# Patient Record
Sex: Male | Born: 1990 | Race: White | Hispanic: No | Marital: Single | Smoking: Never smoker
Health system: Southern US, Community
[De-identification: ages and names within clinical notes are randomized; demographics above are authoritative.]

## PROBLEM LIST (undated history)

## (undated) DIAGNOSIS — S34102A Unspecified injury to L2 level of lumbar spinal cord, initial encounter: Secondary | ICD-10-CM

## (undated) DIAGNOSIS — S069XAA Unspecified intracranial injury with loss of consciousness status unknown, initial encounter: Secondary | ICD-10-CM

## (undated) DIAGNOSIS — S069X9A Unspecified intracranial injury with loss of consciousness of unspecified duration, initial encounter: Secondary | ICD-10-CM

## (undated) HISTORY — PX: BRAIN SURGERY: SHX531

## (undated) HISTORY — PX: BACK SURGERY: SHX140

---

## 2013-04-10 ENCOUNTER — Ambulatory Visit: Payer: Self-pay | Admitting: Surgery

## 2013-04-10 ENCOUNTER — Encounter: Payer: Self-pay | Admitting: Surgery

## 2013-04-21 ENCOUNTER — Encounter: Payer: Self-pay | Admitting: Surgery

## 2013-06-22 ENCOUNTER — Encounter: Payer: Self-pay | Admitting: Surgery

## 2013-06-26 ENCOUNTER — Encounter: Payer: Self-pay | Admitting: Surgery

## 2013-07-03 ENCOUNTER — Ambulatory Visit: Payer: Self-pay | Admitting: Endocrinology

## 2013-07-20 ENCOUNTER — Encounter: Payer: Self-pay | Admitting: Surgery

## 2013-08-07 ENCOUNTER — Ambulatory Visit: Payer: Self-pay | Admitting: Surgery

## 2013-08-20 ENCOUNTER — Encounter: Payer: Self-pay | Admitting: Surgery

## 2013-08-22 ENCOUNTER — Ambulatory Visit: Payer: Self-pay | Admitting: Surgery

## 2013-09-16 ENCOUNTER — Ambulatory Visit: Payer: Self-pay

## 2013-09-19 ENCOUNTER — Encounter: Payer: Self-pay | Admitting: Surgery

## 2013-10-09 ENCOUNTER — Other Ambulatory Visit: Payer: Self-pay | Admitting: Surgery

## 2013-10-09 LAB — SEDIMENTATION RATE: Erythrocyte Sed Rate: 10 mm/h

## 2014-10-30 IMAGING — CR RIGHT FOOT COMPLETE - 3+ VIEW
1 series · 3 of 3 positions shown · non-contrast
Comparison: None.

CLINICAL DATA: Soft tissue ulcer.

EXAM:
RIGHT FOOT COMPLETE - 3+ VIEW

[Series 1: ap · 0.17mm/px · 3 of 3 slices shown]
[im 1/3]
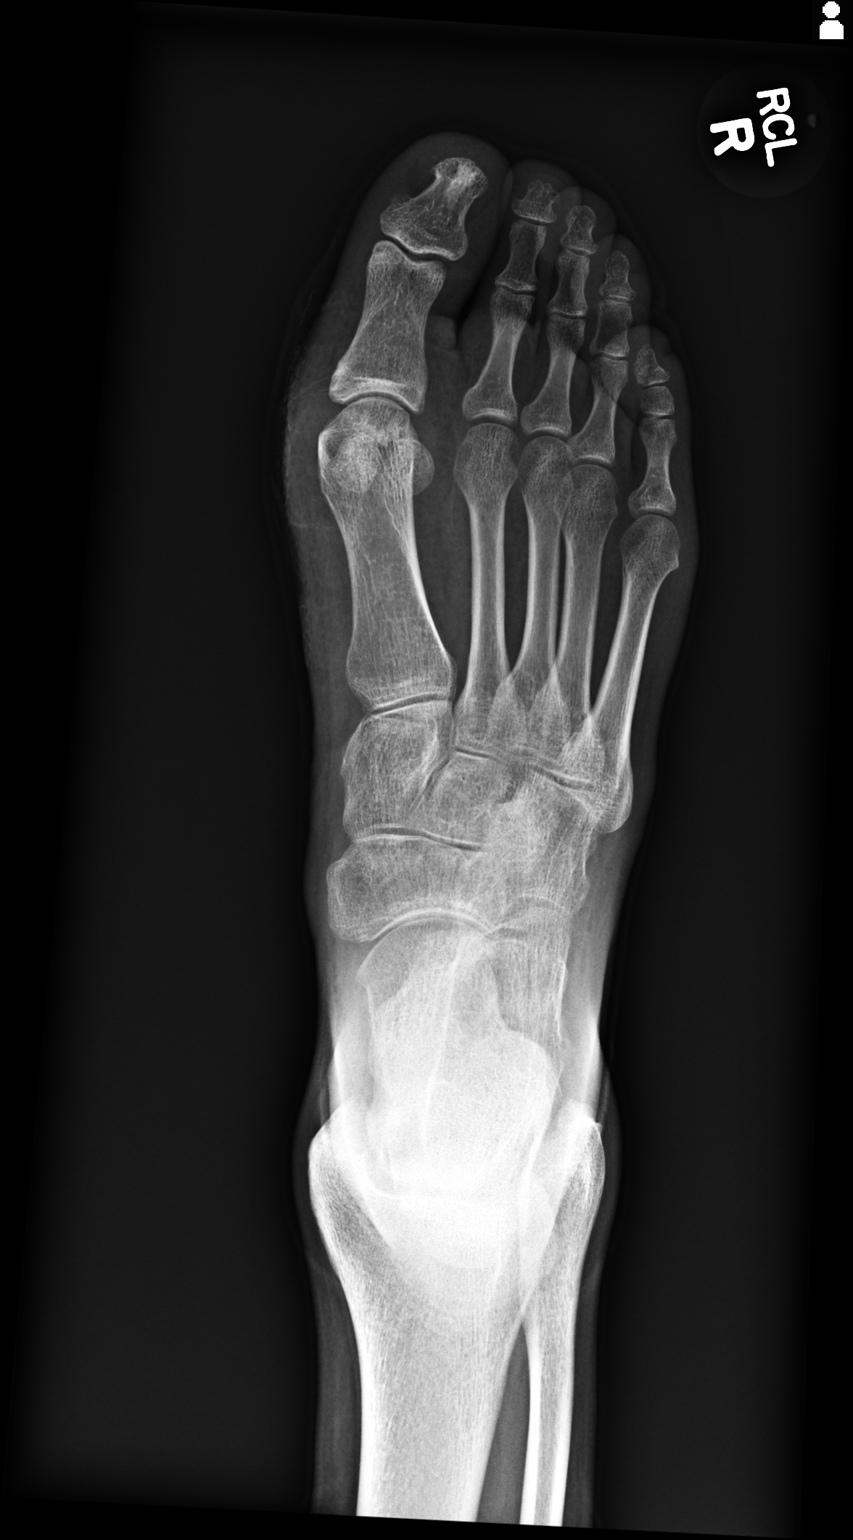
[im 2/3]
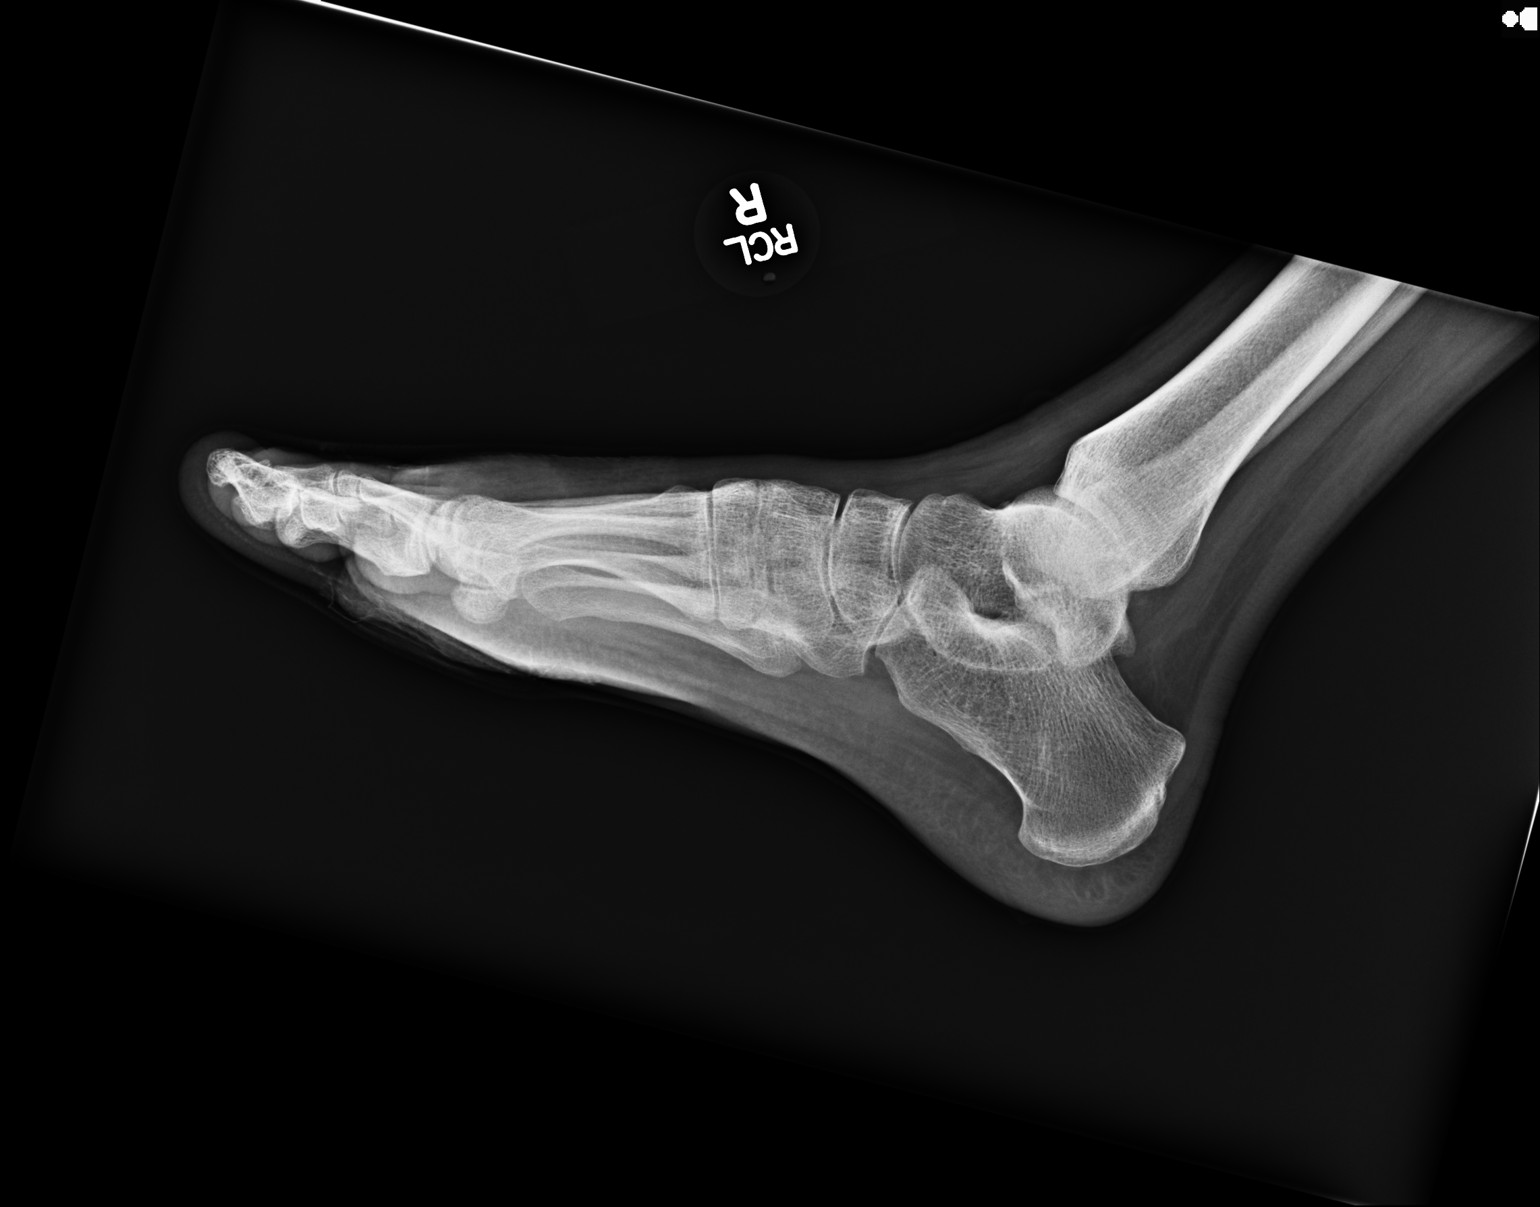
[im 3/3]
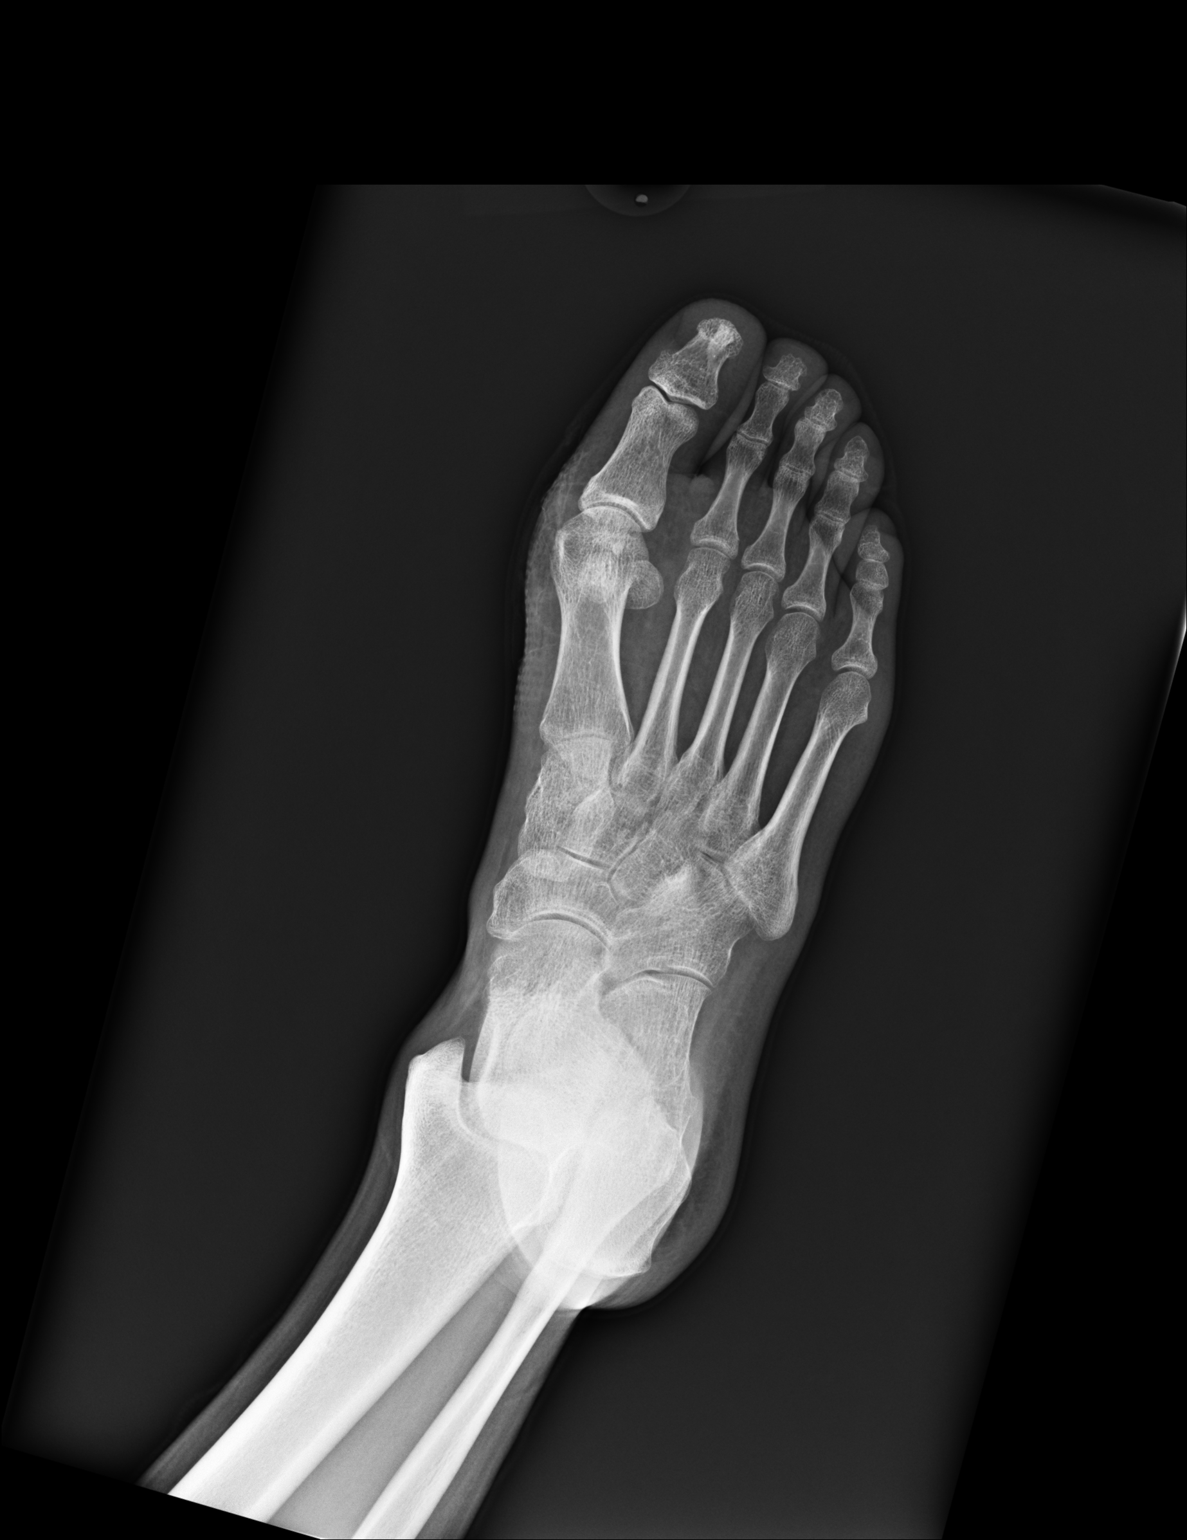

[3 of 3 positions shown; findings below may reference images not displayed]

FINDINGS: Bandage is present medial to the 1st MTP joint. Mild degenerative
change in the 1st MTP.

Negative for fracture or osteomyelitis.
IMPRESSION: No acute abnormality.

## 2015-01-22 IMAGING — CR RIGHT FOURTH TOE
1 series · 3 of 3 positions shown · non-contrast
Comparison: April 10, 2013

CLINICAL DATA: Open wound

EXAM:
RIGHT FOURTH TOE

[Series 1: x toes ap right · 0.14mm/px · 3 of 3 slices shown]
[im 1/3]
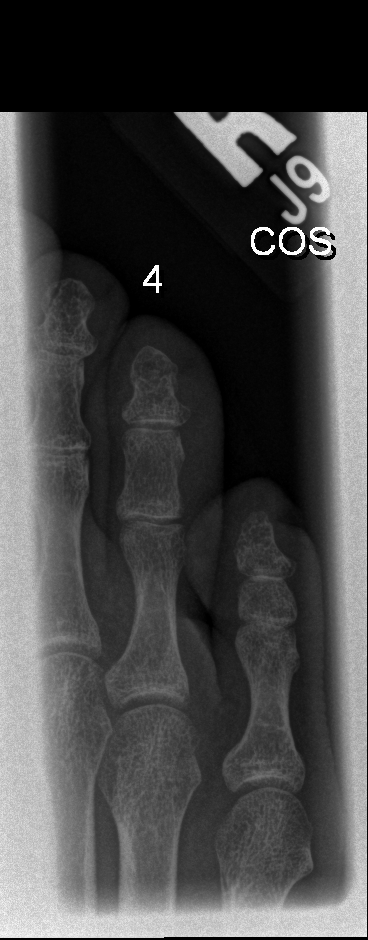
[im 2/3]
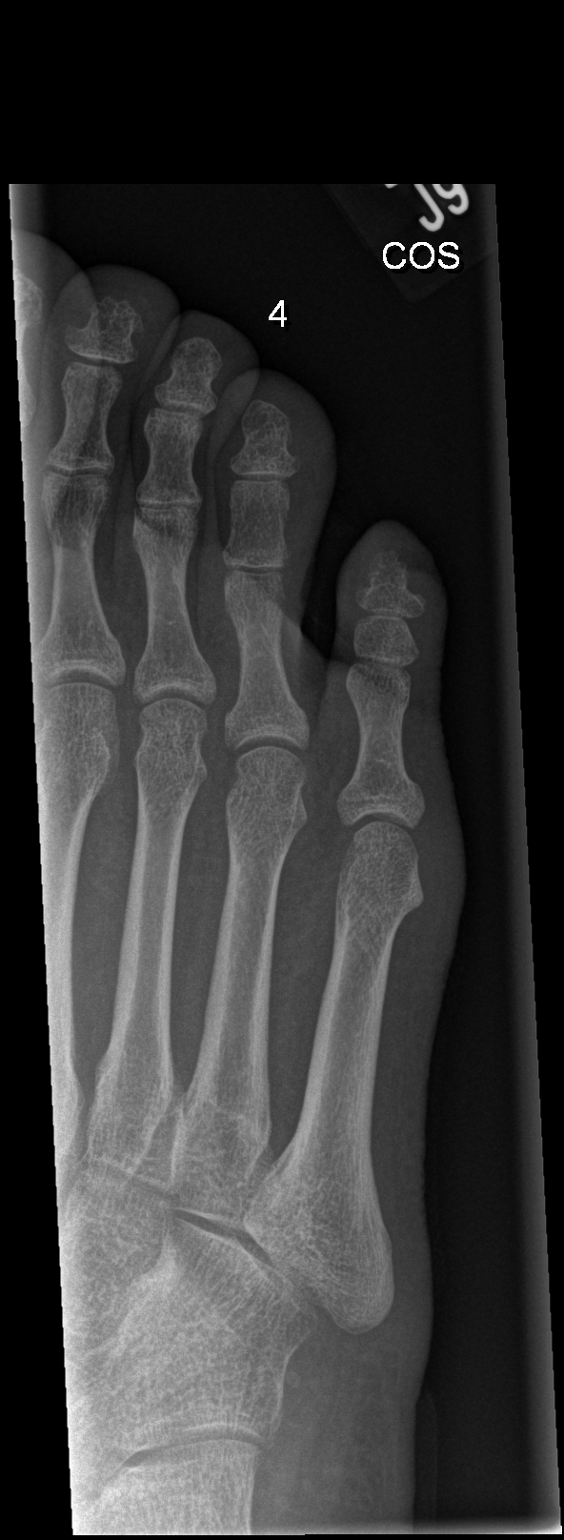
[im 3/3]
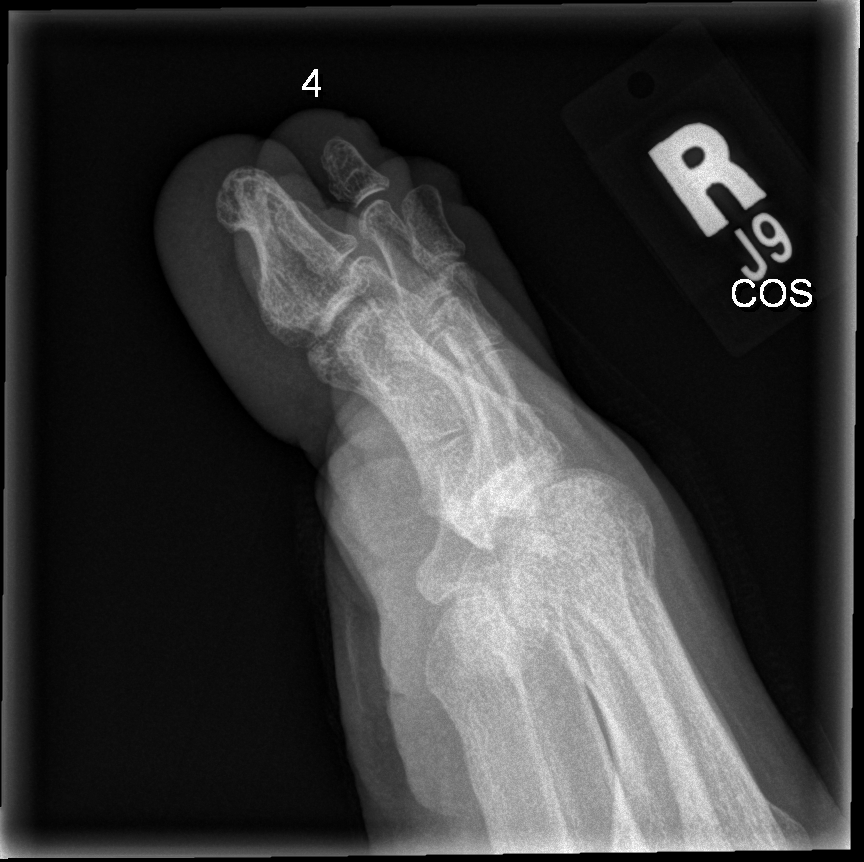

[3 of 3 positions shown; findings below may reference images not displayed]

FINDINGS: Frontal, oblique, and lateral views were obtained. There is no
fracture or dislocation. Joint spaces appear intact. No erosive
change or bony destruction. No soft tissue abscess seen.
IMPRESSION: No fracture or bony destruction apparent.

## 2015-03-13 IMAGING — CR DG CHEST 2V
1 series · 2 of 2 positions shown · non-contrast
Comparison: None.

CLINICAL DATA: Hyperbaric clearance

EXAM:
CHEST  2 VIEW

[Series 1: w chest pa · 0.14mm/px · 2 of 2 slices shown]
[im 1/2]
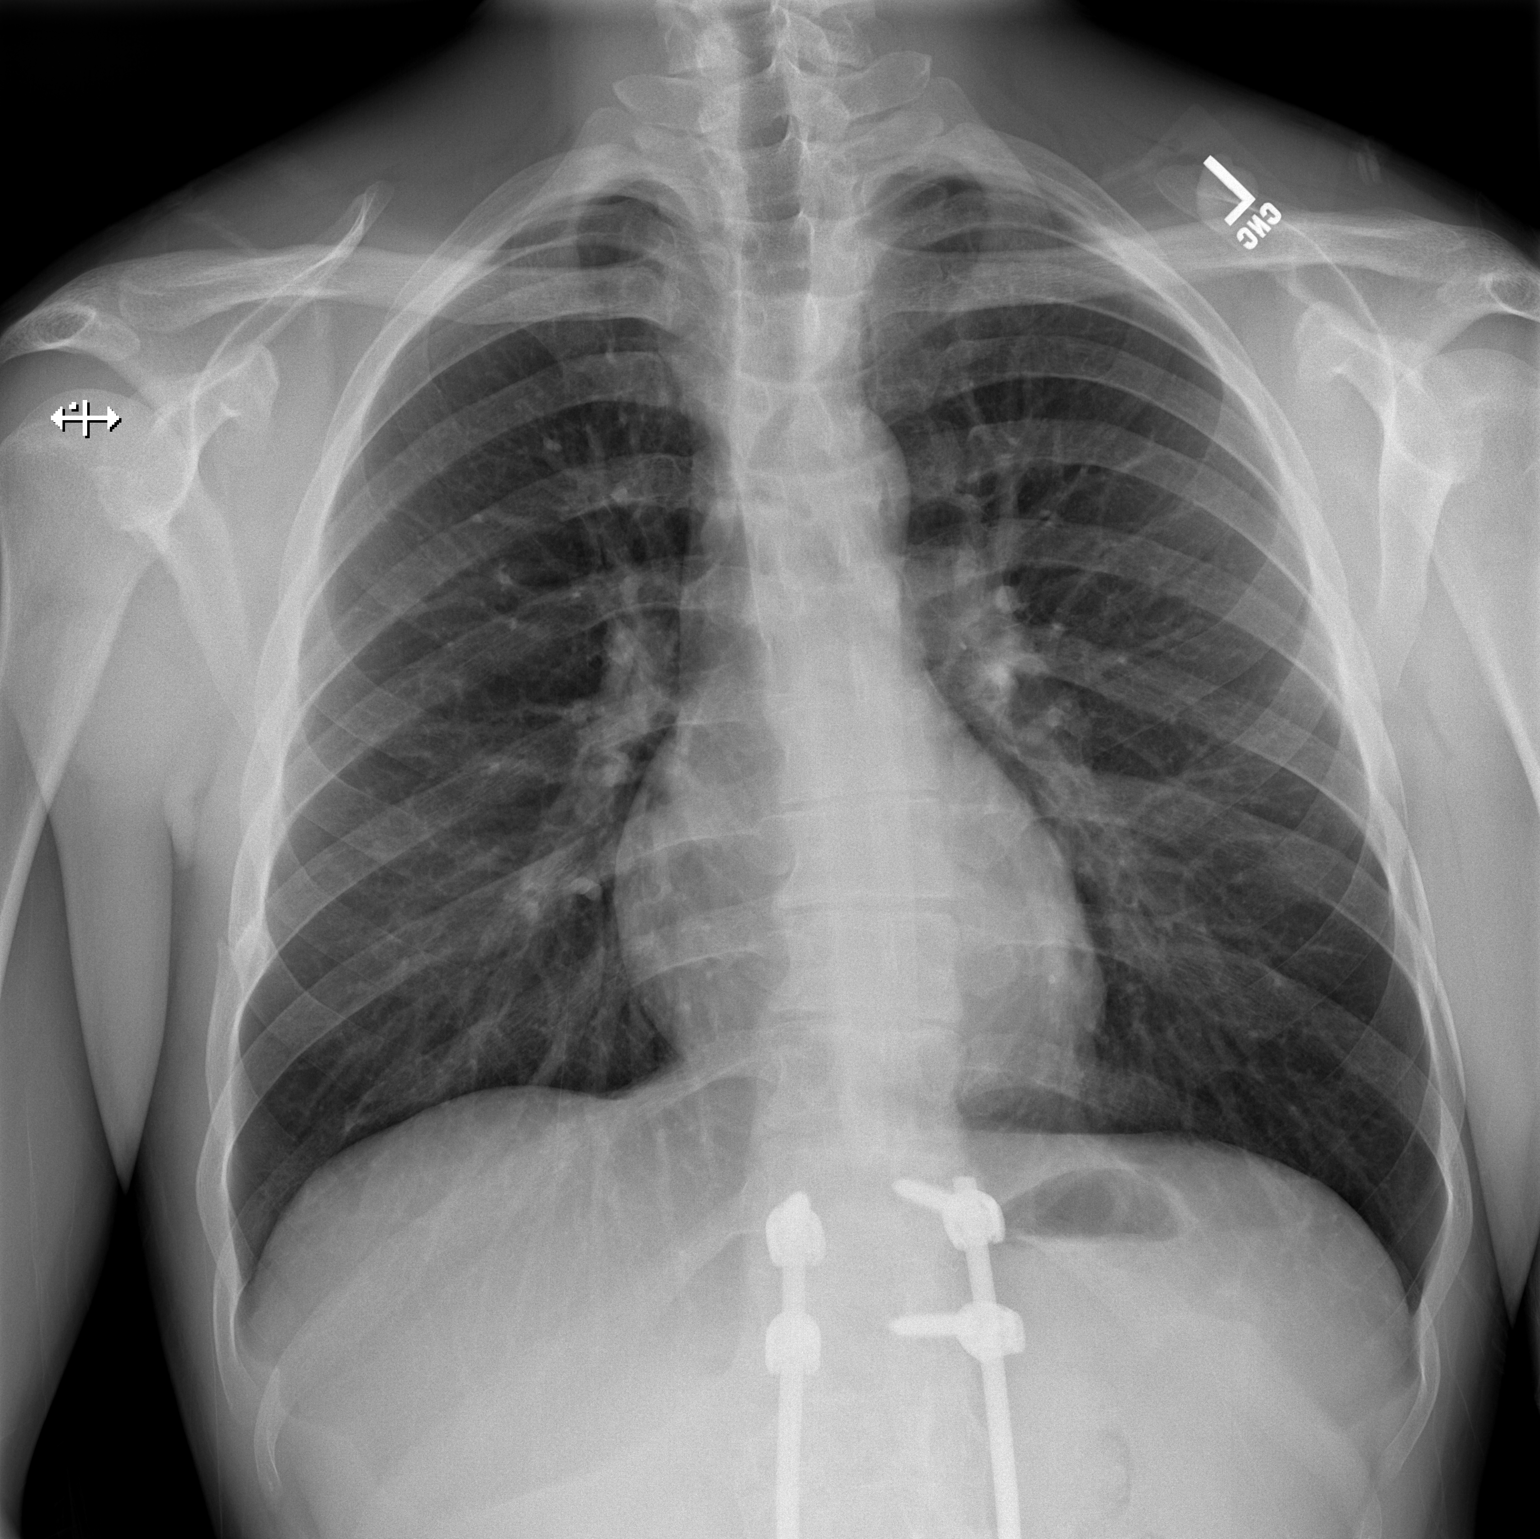
[im 2/2]
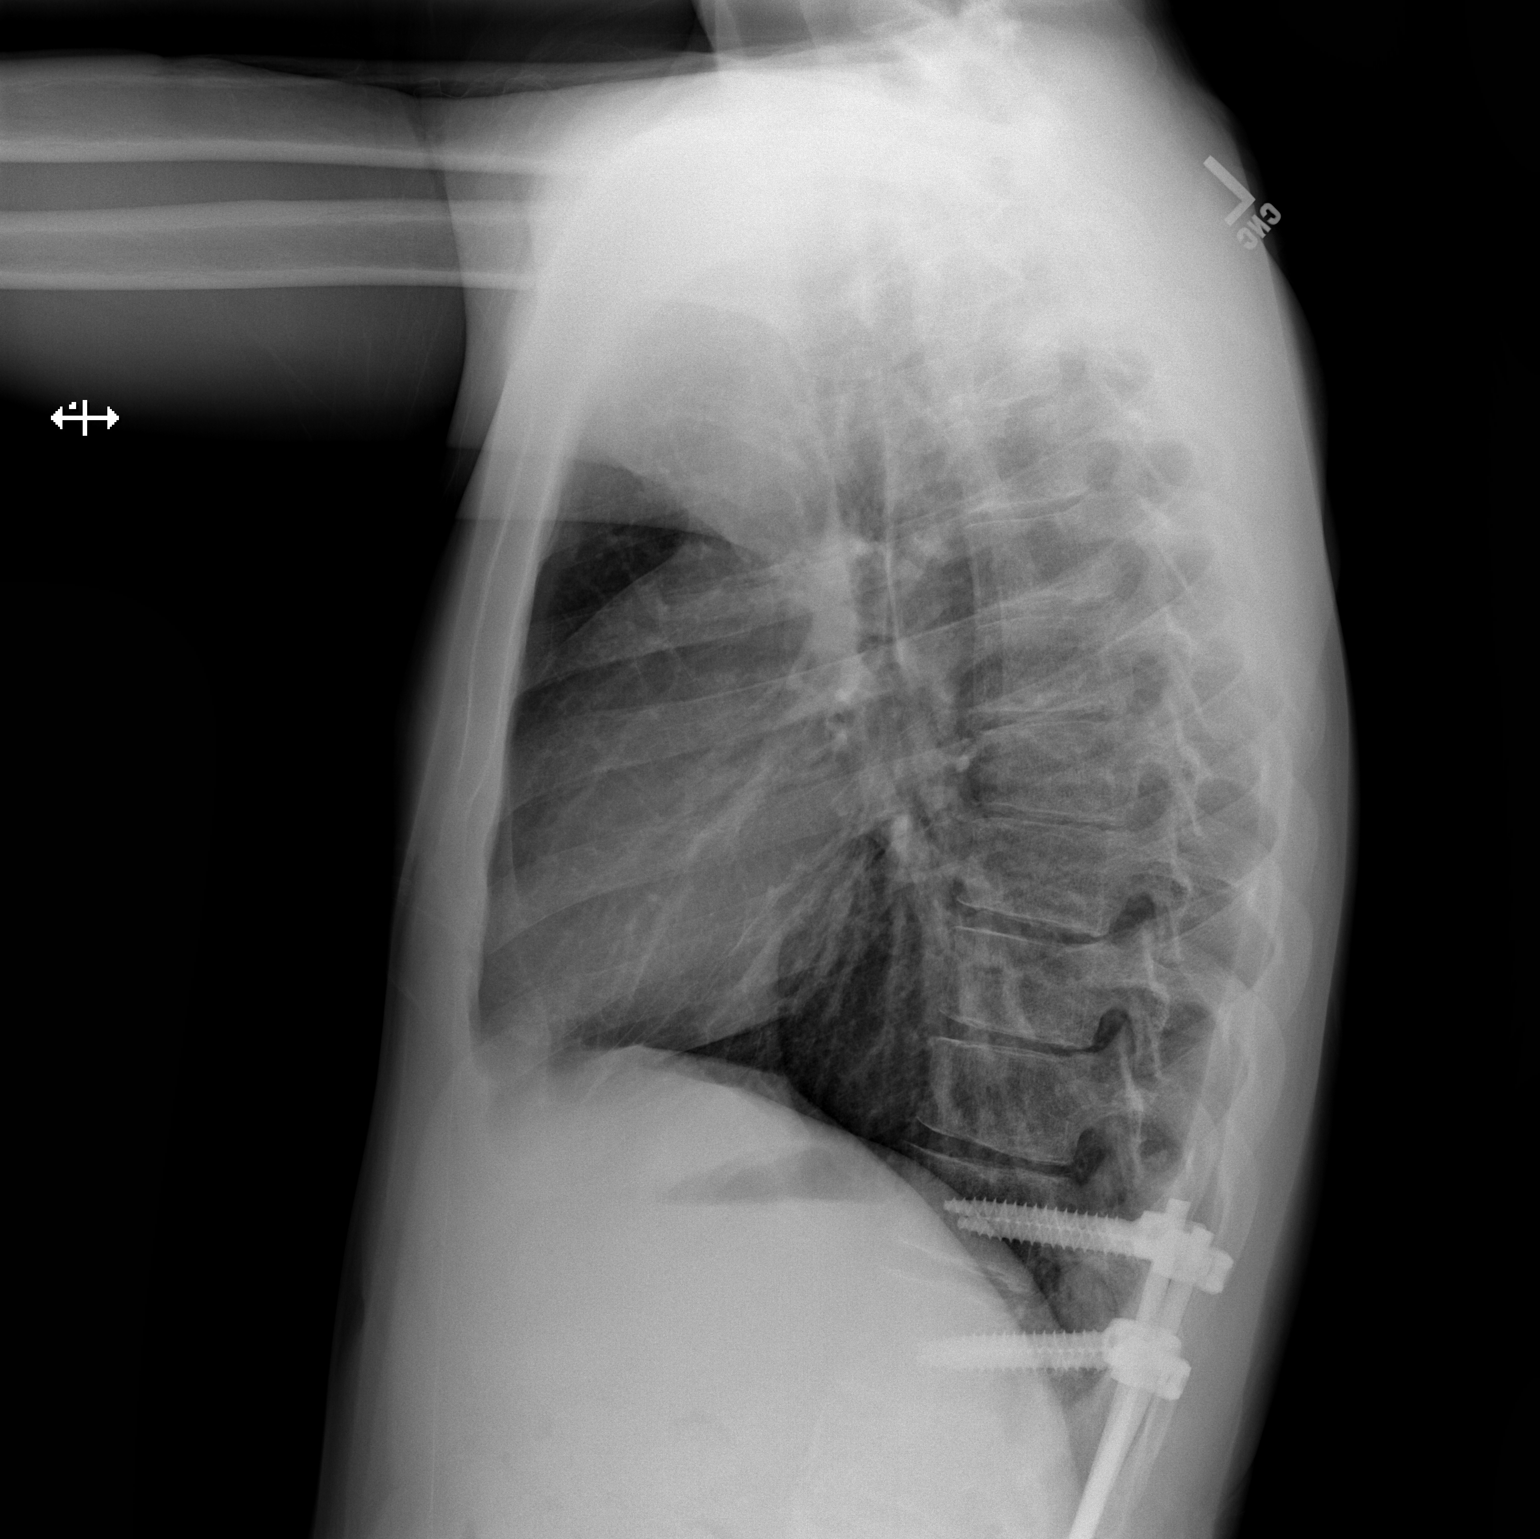

[2 of 2 positions shown; findings below may reference images not displayed]

FINDINGS: The heart size and mediastinal contours are within normal limits.
Both lungs are clear. The visualized skeletal structures show
postoperative changes at thoracolumbar spine. .
IMPRESSION: No active cardiopulmonary disease.

## 2015-04-07 IMAGING — CR DG LUMBAR SPINE COMPLETE 4+V
1 series · 5 of 5 positions shown · non-contrast
Comparison: None.

CLINICAL DATA: Status post fall 6 years ago with an L2 fracture.
Recent fall. Back pain.

EXAM:
LUMBAR SPINE - COMPLETE 4+ VIEW

[Series 1: t sacrum ap · 0.14mm/px · 5 of 5 slices shown]
[im 1/5]
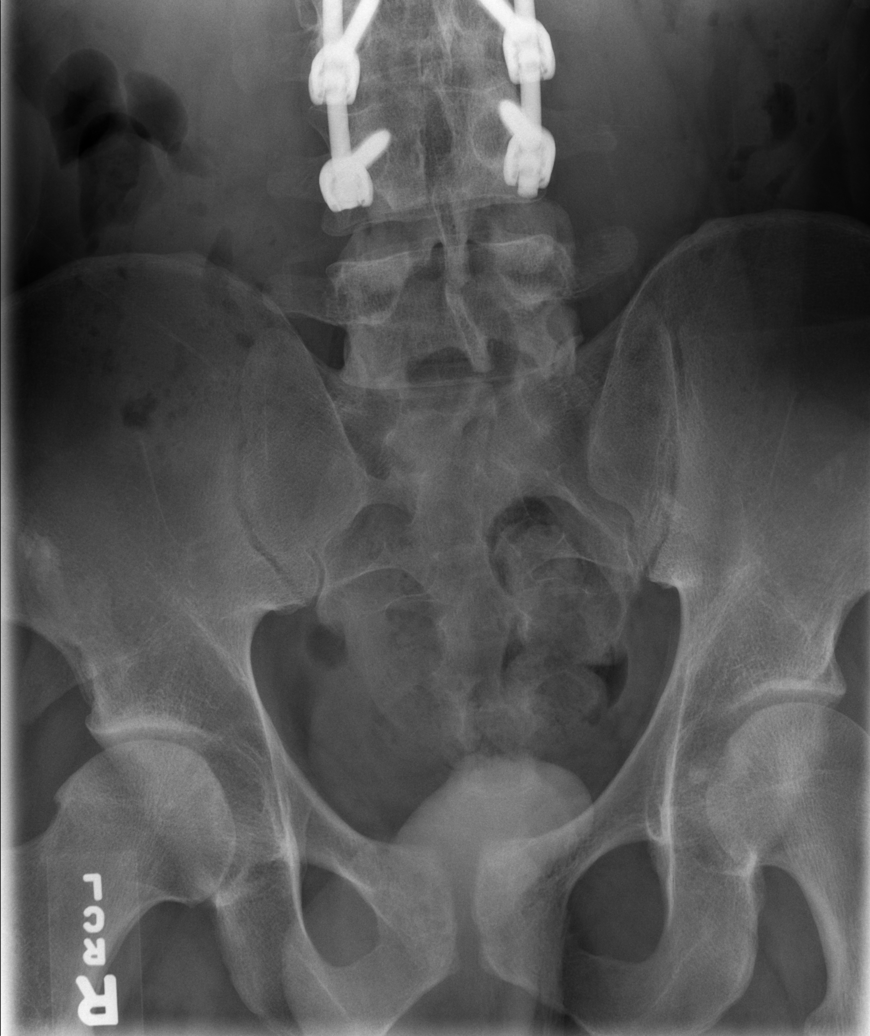
[im 2/5]
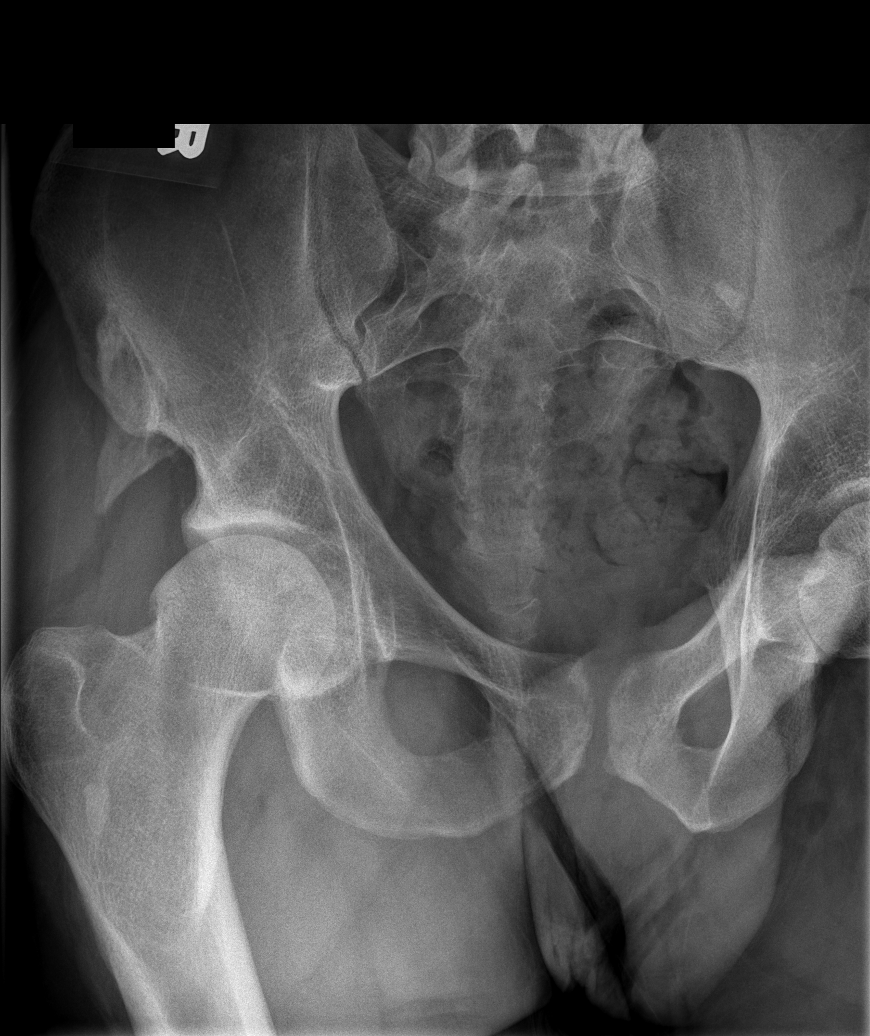
[im 3/5]
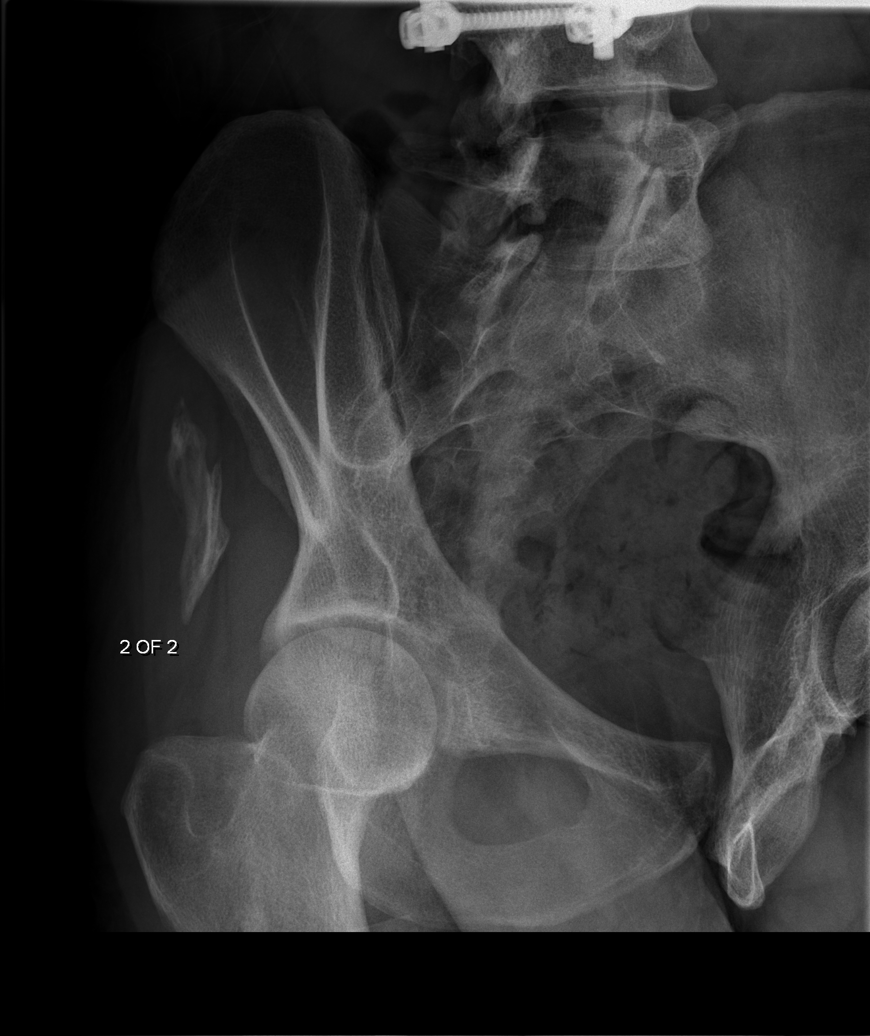
[im 4/5]
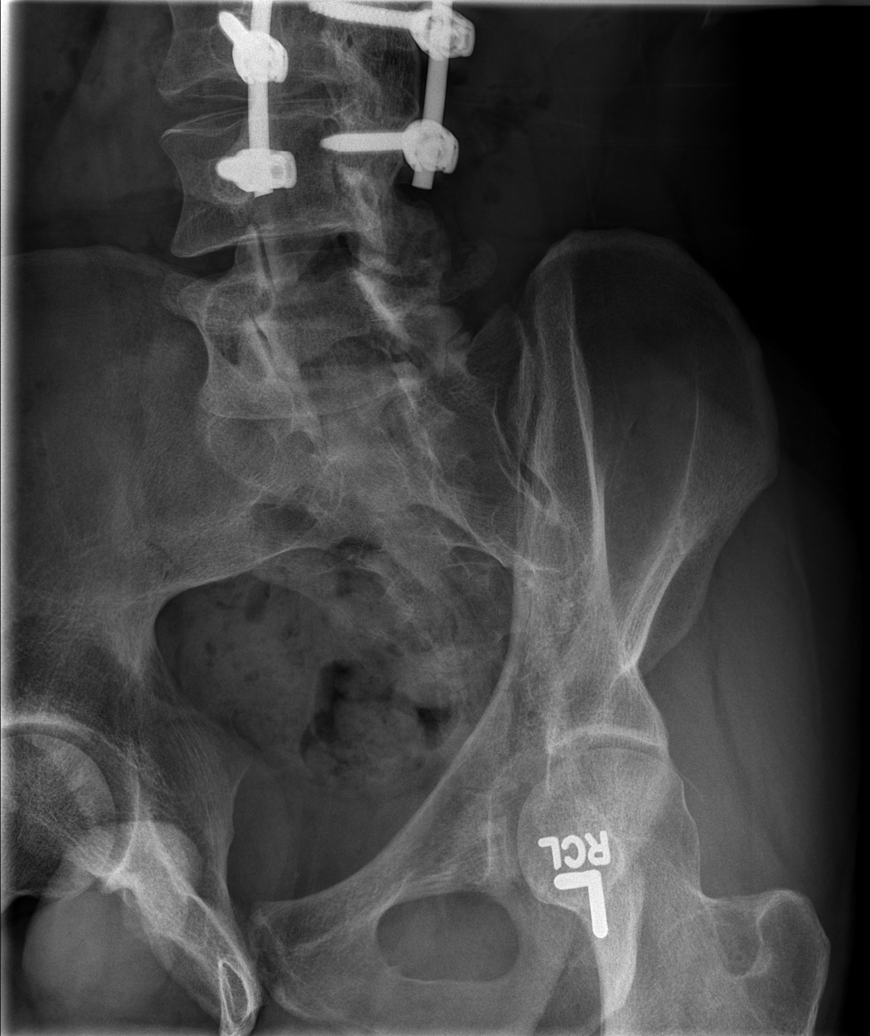
[im 5/5]
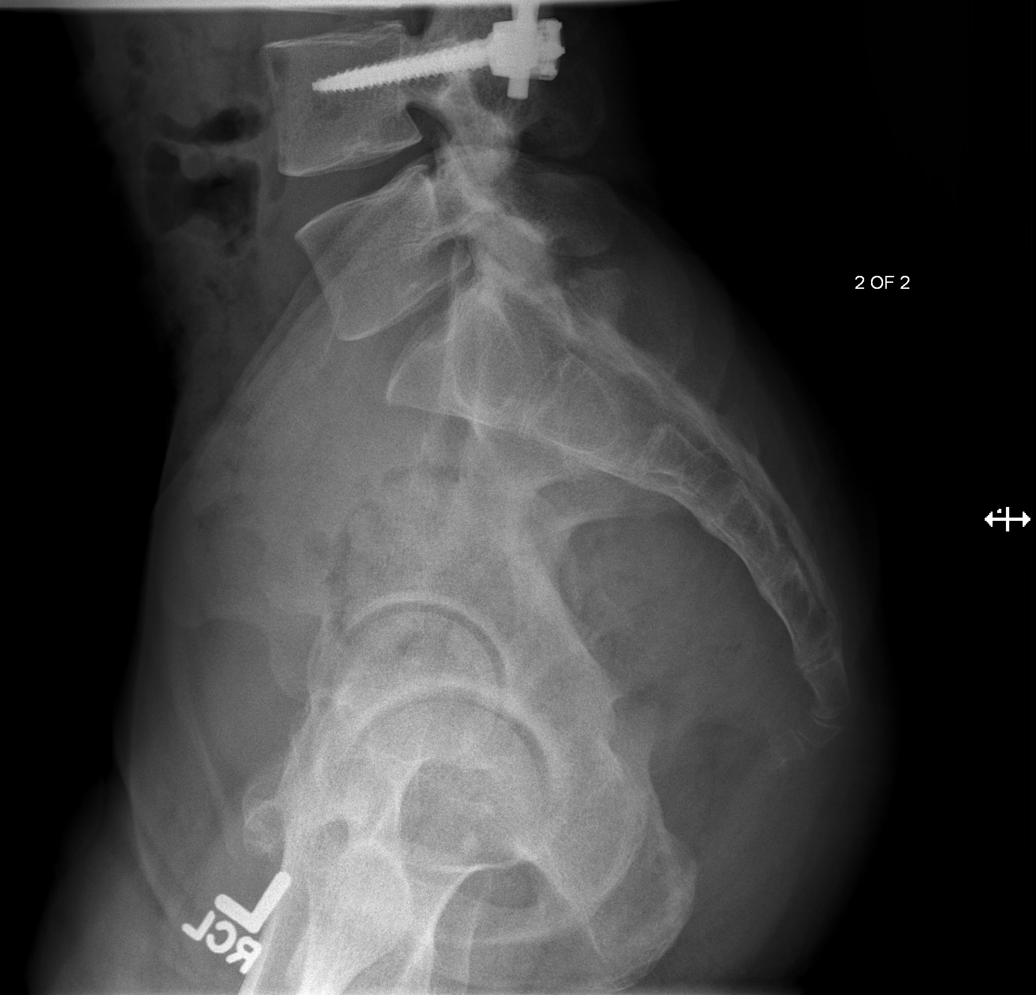

[5 of 5 positions shown; findings below may reference images not displayed]

FINDINGS: The patient is status post T12-L4 fusion for an L2 superior endplate
compression fracture. No acute fracture is identified. Vertebral
body alignment is maintained. Spinal stabilization hardware is
intact. There is loss of disc space height at L1-2 and L2-3.
Paraspinous structures are unremarkable.
IMPRESSION: No acute finding.

Status post T12-L4 fusion for an L2 compression fracture.

Degenerative disc disease L1-2 and L2-3.

## 2015-09-07 ENCOUNTER — Inpatient Hospital Stay
Admission: EM | Admit: 2015-09-07 | Discharge: 2015-09-11 | DRG: 378 | Disposition: A | Discharge status: Home / Self Care | Payer: PRIVATE HEALTH INSURANCE | Attending: Specialist | Admitting: Specialist

## 2015-09-07 ENCOUNTER — Encounter: Payer: Self-pay | Admitting: Emergency Medicine

## 2015-09-07 DIAGNOSIS — K921 Melena: Principal | ICD-10-CM | POA: Diagnosis present

## 2015-09-07 DIAGNOSIS — K2210 Ulcer of esophagus without bleeding: Secondary | ICD-10-CM | POA: Diagnosis present

## 2015-09-07 DIAGNOSIS — G8929 Other chronic pain: Secondary | ICD-10-CM | POA: Diagnosis present

## 2015-09-07 DIAGNOSIS — M545 Low back pain: Secondary | ICD-10-CM | POA: Diagnosis present

## 2015-09-07 DIAGNOSIS — T39395A Adverse effect of other nonsteroidal anti-inflammatory drugs [NSAID], initial encounter: Secondary | ICD-10-CM | POA: Diagnosis present

## 2015-09-07 DIAGNOSIS — D62 Acute posthemorrhagic anemia: Secondary | ICD-10-CM | POA: Diagnosis present

## 2015-09-07 DIAGNOSIS — X58XXXS Exposure to other specified factors, sequela: Secondary | ICD-10-CM | POA: Diagnosis present

## 2015-09-07 DIAGNOSIS — K449 Diaphragmatic hernia without obstruction or gangrene: Secondary | ICD-10-CM | POA: Diagnosis present

## 2015-09-07 DIAGNOSIS — Z79899 Other long term (current) drug therapy: Secondary | ICD-10-CM | POA: Diagnosis not present

## 2015-09-07 DIAGNOSIS — K2960 Other gastritis without bleeding: Secondary | ICD-10-CM | POA: Diagnosis present

## 2015-09-07 DIAGNOSIS — S069X9S Unspecified intracranial injury with loss of consciousness of unspecified duration, sequela: Secondary | ICD-10-CM

## 2015-09-07 DIAGNOSIS — R55 Syncope and collapse: Secondary | ICD-10-CM | POA: Diagnosis present

## 2015-09-07 DIAGNOSIS — D649 Anemia, unspecified: Secondary | ICD-10-CM

## 2015-09-07 DIAGNOSIS — G8221 Paraplegia, complete: Secondary | ICD-10-CM | POA: Diagnosis present

## 2015-09-07 DIAGNOSIS — D500 Iron deficiency anemia secondary to blood loss (chronic): Secondary | ICD-10-CM | POA: Diagnosis present

## 2015-09-07 DIAGNOSIS — E871 Hypo-osmolality and hyponatremia: Secondary | ICD-10-CM | POA: Diagnosis present

## 2015-09-07 DIAGNOSIS — K269 Duodenal ulcer, unspecified as acute or chronic, without hemorrhage or perforation: Secondary | ICD-10-CM | POA: Diagnosis present

## 2015-09-07 DIAGNOSIS — Z87820 Personal history of traumatic brain injury: Secondary | ICD-10-CM | POA: Diagnosis not present

## 2015-09-07 DIAGNOSIS — D72829 Elevated white blood cell count, unspecified: Secondary | ICD-10-CM | POA: Diagnosis present

## 2015-09-07 DIAGNOSIS — K922 Gastrointestinal hemorrhage, unspecified: Secondary | ICD-10-CM | POA: Diagnosis present

## 2015-09-07 HISTORY — DX: Unspecified intracranial injury with loss of consciousness of unspecified duration, initial encounter: S06.9X9A

## 2015-09-07 HISTORY — DX: Unspecified intracranial injury with loss of consciousness status unknown, initial encounter: S06.9XAA

## 2015-09-07 HISTORY — DX: Unspecified injury to L2 level of lumbar spinal cord, initial encounter: S34.102A

## 2015-09-07 LAB — COMPREHENSIVE METABOLIC PANEL
ALK PHOS: 51 U/L (ref 38–126)
ALT: 26 U/L (ref 17–63)
ANION GAP: 12 (ref 5–15)
AST: 39 U/L (ref 15–41)
Albumin: 4.4 g/dL (ref 3.5–5.0)
BILIRUBIN TOTAL: 0.6 mg/dL (ref 0.3–1.2)
BUN: 34 mg/dL — ABNORMAL HIGH (ref 6–20)
CALCIUM: 9.1 mg/dL (ref 8.9–10.3)
CO2: 22 mmol/L (ref 22–32)
Chloride: 102 mmol/L (ref 101–111)
Creatinine, Ser: 0.97 mg/dL (ref 0.61–1.24)
GFR calc non Af Amer: >60 mL/min (ref >60)
Glucose, Bld: 156 mg/dL — ABNORMAL HIGH (ref 65–99)
Potassium: 3.5 mmol/L (ref 3.5–5.1)
SODIUM: 136 mmol/L (ref 135–145)
TOTAL PROTEIN: 6.5 g/dL (ref 6.5–8.1)

## 2015-09-07 LAB — CBC WITH DIFFERENTIAL/PLATELET
BASOS PCT: 0 %
Basophils Absolute: 0 10*3/uL (ref 0–0.1)
EOS ABS: 0 10*3/uL (ref 0–0.7)
Eosinophils Relative: 0 %
HEMATOCRIT: 19 % — AB (ref 40.0–52.0)
Hemoglobin: 6.7 g/dL — ABNORMAL LOW (ref 13.0–18.0)
Lymphocytes Relative: 25 %
Lymphs Abs: 3.4 10*3/uL (ref 1.0–3.6)
MCH: 30.2 pg (ref 26.0–34.0)
MCHC: 35.4 g/dL (ref 32.0–36.0)
MCV: 85.5 fL (ref 80.0–100.0)
MONO ABS: 1.5 10*3/uL — AB (ref 0.2–1.0)
Monocytes Relative: 11 %
NEUTROS PCT: 64 %
Neutro Abs: 9 10*3/uL — ABNORMAL HIGH (ref 1.4–6.5)
Platelets: 406 10*3/uL (ref 150–440)
RBC: 2.22 MIL/uL — ABNORMAL LOW (ref 4.40–5.90)
RDW: 13.4 % (ref 11.5–14.5)
WBC: 13.9 10*3/uL — ABNORMAL HIGH (ref 3.8–10.6)

## 2015-09-07 LAB — ABO/RH: ABO/RH(D): A POS

## 2015-09-07 MED ORDER — SODIUM CHLORIDE 0.9 % IV BOLUS (SEPSIS)
1000.0000 mL | Freq: Once | INTRAVENOUS | Status: AC
  Administered 2015-09-07: 1000 mL via INTRAVENOUS

## 2015-09-07 MED ORDER — PANTOPRAZOLE SODIUM 40 MG IV SOLR
40.0000 mg | Freq: Once | INTRAVENOUS | Status: AC
  Administered 2015-09-07: 40 mg via INTRAVENOUS
  Filled 2015-09-07: qty 40

## 2015-09-07 MED ORDER — SODIUM CHLORIDE 0.9 % IV SOLN
10.0000 mL/h | Freq: Once | INTRAVENOUS | Status: AC
  Administered 2015-09-07: 10 mL/h via INTRAVENOUS

## 2015-09-07 NOTE — ED Provider Notes (Signed)
[facility] Emergency Department Provider Note ____________________________________________  Time seen: ~2035  I have reviewed the triage vital signs and the nursing notes.   HISTORY  Chief Complaint Bloody diarrhea  History limited by: Not Limited   HPI Devon Rice is a 25 y.o. male who presents to the emergency department today because of concerns for bloody diarrhea.The patient states that these symptoms started 2 days ago. He states the night before he was at a concert tonight a barbecue sandwich. The next morning he woke up with nausea and vomiting. He did not appreciate any blood in his vomit. He didn't think at that time that it was likely due to food poisoning. He did not have any significant abdominal pain. He does state that that evening he started developing bloody stool. He is now had multiple bloody diarrhea movements. He describes it as being dark red. He is also now developed dizziness and feelings like he is going to pass out when he stands up. He denies any history of similar symptoms. Denies any fevers.   Past Medical History  Diagnosis Date  . Brain injury (HCC)   . 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   Past Surgical History  Procedure Laterality Date  . Back surgery    . Brain surgery      No current outpatient prescriptions on file.  Allergies Review of patient's allergies indicates no known allergies.  No family history on file.  Social History Social History  Substance Use Topics  . Smoking status: Never Smoker   . Smokeless tobacco: None  . Alcohol Use: No    Review of Systems  Constitutional: Negative for fever. Cardiovascular: Negative for chest pain. Respiratory: Negative for shortness of breath. Gastrointestinal: Negative for abdominal pain. Positive for vomiting and bloody diarrhea Neurological: Negative for headaches, focal weakness or  numbness.   10-point ROS otherwise negative.  ____________________________________________   PHYSICAL EXAM:  VITAL SIGNS: ED Triage Vitals  Enc Vitals Group     BP 09/07/15 1948 137/84 mmHg     Pulse Rate 09/07/15 1948 134     Resp 09/07/15 1948 20     Temp 09/07/15 1948 98.1 F (36.7 C)     Temp Source 09/07/15 1948 Oral     SpO2 09/07/15 1948 100 %     Weight 09/07/15 1948 144 lb (65.318 kg)     Height 09/07/15 1948 5\' 10"  (1.778 m)   Constitutional: Alert and oriented. Pale Eyes: Conjunctivae are normal. PERRL. Normal extraocular movements. ENT   Head: Normocephalic and atraumatic.   Nose: No congestion/rhinnorhea.   Mouth/Throat: Mucous membranes are moist.   Neck: No stridor. Hematological/Lymphatic/Immunilogical: No cervical lymphadenopathy. Cardiovascular: Tachycardic, regular rhythm.  No murmurs, rubs, or gallops. Respiratory: Normal respiratory effort without tachypnea nor retractions. Breath sounds are clear and equal bilaterally. No wheezes/rales/rhonchi. Gastrointestinal: Soft and nontender. No distention.  Rectal: Dark red blood on glove. Guaiac positive. Musculoskeletal: Normal range of motion in all extremities. No joint effusions.   Neurologic:  Normal speech and language. No gross focal neurologic deficits are appreciated.  Skin:  Skin is warm, dry and intact. No rash noted. Psychiatric: Mood and affect are normal. Speech and behavior are normal. Patient exhibits appropriate insight and judgment.  ____________________________________________    LABS (pertinent positives/negatives)  Labs Reviewed  CBC WITH DIFFERENTIAL/PLATELET - Abnormal; Notable for the following:    WBC 13.9 (*)  RBC 2.22 (*)    Hemoglobin 6.7 (*)    HCT 19.0 (*)    Neutro Abs 9.0 (*)    Monocytes Absolute 1.5 (*)    All other components within normal limits  COMPREHENSIVE METABOLIC PANEL - Abnormal; Notable for the following:    Glucose, Bld 156 (*)    BUN 34  (*)    All other components within normal limits  GASTROINTESTINAL PANEL BY PCR, STOOL (REPLACES STOOL CULTURE)  TYPE AND SCREEN  PREPARE RBC (CROSSMATCH)     ____________________________________________   EKG  None  ____________________________________________    RADIOLOGY  None  ____________________________________________   PROCEDURES  Procedure(s) performed: None  Critical Care performed: Yes, see critical care note(s)  CRITICAL CARE Performed by: Phineas Semen   Total critical care time: 30 minutes  Critical care time was exclusive of separately billable procedures and treating other patients.  Critical care was necessary to treat or prevent imminent or life-threatening deterioration.  Critical care was time spent personally by me on the following activities: development of treatment plan with patient and/or surrogate as well as nursing, discussions with consultants, evaluation of patient's response to treatment, examination of patient, obtaining history from patient or surrogate, ordering and performing treatments and interventions, ordering and review of laboratory studies, ordering and review of radiographic studies, pulse oximetry and re-evaluation of patient's condition.  ____________________________________________   INITIAL IMPRESSION / ASSESSMENT AND PLAN / ED COURSE  Pertinent labs & imaging results that were available during my care of the patient were reviewed by me and considered in my medical decision making (see chart for details).  Patient presented to the emergency department today because of concerns for bloody diarrhea. Clinical story concerning for anemia given near syncopal episodes with standing. Patient additionally quite tachycardic and pale on exam. Patient was guaiac positive with dark red blood on the glove. Patient's blood work does show anemia at 6.7. Because of this the patient was consented for blood transfusions. The patient  will be admitted to the hospitalist service for further workup and evaluation.  ____________________________________________   FINAL CLINICAL IMPRESSION(S) / ED DIAGNOSES  Final diagnoses:  Gastrointestinal hemorrhage, unspecified gastritis, unspecified gastrointestinal hemorrhage type  Anemia, unspecified anemia type     Phineas Semen, MD 09/07/15 2143

## 2015-09-07 NOTE — H&P (Signed)
[facility] [place] [facility]   PATIENT NAME: Devon KeensBrian Tory    MR#:  [id number]  DATE OF BIRTH:  02-16-1991  DATE OF ADMISSION:  09/07/2015  PRIMARY CARE PHYSICIAN: No primary care provider on file.   REQUESTING/REFERRING PHYSICIAN: Derrill KayGoodman, MD  CHIEF COMPLAINT:  Melena  HISTORY OF PRESENT ILLNESS:  Devon Rice  is a 25 y.o. male who presents with Lightheadedness and complaint of melena for the last 3 days. Patient states that he has not had any abdominal pain, nausea or vomiting, fever. He has never had troubles with GI bleed before. 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 In the ED he was found to have a hemoglobin of 6.7. PRBC transfusion ordered, and hospitalists were called for admission.  PAST MEDICAL HISTORY:   Past Medical History  Diagnosis Date  . Brain injury (HCC)   . 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 Back surgery    . Brain surgery      SOCIAL HISTORY:   Social History  Substance Use Topics  . Smoking status: Never Smoker   . Smokeless tobacco: Not on file  . Alcohol Use: No    FAMILY HISTORY:  No family history on file.  DRUG ALLERGIES:  No Known Allergies  MEDICATIONS AT HOME:   Prior to Admission medications   Medication Sig Start Date End Date Taking? Authorizing Provider  diphenhydramine-acetaminophen (TYLENOL PM) 25-500 MG TABS tablet Take 2 tablets by mouth at bedtime as needed (for sleep/pain).   Yes Historical Provider, MD  donepezil (ARICEPT) 10 MG tablet Take 10 mg by mouth at bedtime.   Yes Historical Provider, MD  ibuprofen (ADVIL,MOTRIN) 200 MG tablet Take 200-400 mg by mouth every 6 (six) hours as needed for headache or mild pain.   Yes Historical Provider, MD  pregabalin (LYRICA) 200 MG capsule Take 200 mg by mouth 3 (three) times daily.   Yes Historical  Provider, MD  traZODone (DESYREL) 50 MG tablet Take 50 mg by mouth at bedtime.   Yes Historical Provider, MD    REVIEW OF SYSTEMS:  Review of Systems  Constitutional: Positive for malaise/fatigue. Negative for fever, chills and weight loss.  HENT: Negative for ear pain, hearing loss and tinnitus.   Eyes: Negative for blurred vision, double vision, pain and redness.  Respiratory: Negative for cough, hemoptysis and shortness of breath.   Cardiovascular: Negative for chest pain, palpitations, orthopnea and leg swelling.  Gastrointestinal: Positive for melena. Negative for nausea, vomiting, abdominal pain, diarrhea and constipation.  Genitourinary: Negative for dysuria, frequency and hematuria.  Musculoskeletal: Negative for back pain, joint pain and neck pain.  Skin:       No acne, rash, or lesions  Neurological: Negative for dizziness, tremors, focal weakness and weakness.  Endo/Heme/Allergies: Negative for polydipsia. Does not bruise/bleed easily.  Psychiatric/Behavioral: Negative for depression. The patient is not nervous/anxious and does not have insomnia.      VITAL SIGNS:   Filed Vitals:   09/07/15 2249 09/07/15 2322 09/07/15 2324 09/07/15 2330  BP: 120/79 124/82 124/82 137/77  Pulse: 107 108 94 101  Temp: 99.5 F (37.5 C)  100 F (37.8 C)   TempSrc:   Oral   Resp: 16 13 15 18   Height:      Weight:      SpO2: 100% 100%  100% 100%   Wt Readings from Last 3 Encounters:  09/07/15 65.318 kg (144 lb)    PHYSICAL EXAMINATION:  Physical Exam  Vitals reviewed. Constitutional: He is oriented to person, place, and time. He appears well-developed and well-nourished. No distress.  HENT:  Head: Normocephalic and atraumatic.  Mouth/Throat: Oropharynx is clear and moist.  Eyes: Conjunctivae and EOM are normal. Pupils are equal, round, and reactive to light. No scleral icterus.  Neck: Normal range of motion. Neck supple. No JVD present. No thyromegaly present.  Cardiovascular:  Normal rate, regular rhythm and intact distal pulses.  Exam reveals no gallop and no friction rub.   No murmur heard. Respiratory: Effort normal and breath sounds normal. No respiratory distress. He has no wheezes. He has no rales.  GI: Soft. Bowel sounds are normal. He exhibits no distension. There is no tenderness.  Musculoskeletal: Normal range of motion. He exhibits no edema.  No arthritis, no gout  Lymphadenopathy:    He has no cervical adenopathy.  Neurological: He is alert and oriented to person, place, and time. No cranial nerve deficit.  No dysarthria, no aphasia  Skin: Skin is warm and dry. No rash noted. No erythema.  Psychiatric: He has a normal mood and affect. His behavior is normal. Judgment and thought content normal.    LABORATORY PANEL:   CBC  Recent Labs Lab 09/07/15 2001  WBC 13.9*  HGB 6.7*  HCT 19.0*  PLT 406   ------------------------------------------------------------------------------------------------------------------  Chemistries   Recent Labs Lab 09/07/15 2001  NA 136  K 3.5  CL 102  CO2 22  GLUCOSE 156*  BUN 34*  CREATININE 0.97  CALCIUM 9.1  AST 39  ALT 26  ALKPHOS 51  BILITOT 0.6   ------------------------------------------------------------------------------------------------------------------  Cardiac Enzymes No results for input(s): TROPONINI in the last 168 hours. ------------------------------------------------------------------------------------------------------------------  RADIOLOGY:  No results found.  EKG:  No orders found for this or any previous visit.  IMPRESSION AND PLAN:  Principal Problem:   GI bleed - Protonix drip, enteric precautions with stool studies ordered in the ED, GI consult. Active Problems:   Blood loss anemia - transfusion ordered in the ED, we'll monitor   History of traumatic brain injury - continue home meds   Leukocytosis - suspect this is likely due to hemoconcentration, will follow  up  All the records are reviewed and case discussed with ED provider. Management plans discussed with the patient and/or family.  DVT PROPHYLAXIS: Mechanical only  GI PROPHYLAXIS: PPI  ADMISSION STATUS: Inpatient  CODE STATUS: Full Code Status History    This patient does not have a recorded code status. Please follow your organizational policy for patients in this situation.      TOTAL TIME TAKING CARE OF THIS PATIENT: 45 minutes.    Mailani Degroote FIELDING 09/07/2015, 11:45 PM  [facility]  Office  [phone]  CC: Primary care physician; No primary care provider on file.

## 2015-09-07 NOTE — ED Notes (Addendum)
Pt to triage via w/c, pale in color; N/V since Sunday with blood in stools; no vomiting today but cont to feel weak with blood in stools; seen on Monday; denies hx of same

## 2015-09-07 NOTE — ED Notes (Addendum)
Pt states he started vomiting and having bloody stools on Sunday morning, thought he had food poisoning. Pt states diarrhea more constant but nausea comes and goes. States abdominal tenderness. Pt states hx of spinal injury and TBI 7 years ago, was in wheelchair for 2 years. Now walks with leg braces. Pt pale, states dizziness, no syncope, denies fever. Pt alert and oriented x4. Friend at bedside.

## 2015-09-08 LAB — GASTROINTESTINAL PANEL BY PCR, STOOL (REPLACES STOOL CULTURE)
ASTROVIRUS: NOT DETECTED
Adenovirus F40/41: NOT DETECTED
CAMPYLOBACTER SPECIES: NOT DETECTED
CYCLOSPORA CAYETANENSIS: NOT DETECTED
Cryptosporidium: NOT DETECTED
E. COLI O157: NOT DETECTED
ENTEROTOXIGENIC E COLI (ETEC): NOT DETECTED
Entamoeba histolytica: NOT DETECTED
Enteroaggregative E coli (EAEC): NOT DETECTED
Enteropathogenic E coli (EPEC): NOT DETECTED
Giardia lamblia: NOT DETECTED
Norovirus GI/GII: NOT DETECTED
PLESIMONAS SHIGELLOIDES: NOT DETECTED
ROTAVIRUS A: NOT DETECTED
SAPOVIRUS (I, II, IV, AND V): NOT DETECTED
SHIGA LIKE TOXIN PRODUCING E COLI (STEC): NOT DETECTED
Salmonella species: NOT DETECTED
Shigella/Enteroinvasive E coli (EIEC): NOT DETECTED
VIBRIO SPECIES: NOT DETECTED
Vibrio cholerae: NOT DETECTED
Yersinia enterocolitica: NOT DETECTED

## 2015-09-08 LAB — BASIC METABOLIC PANEL
ANION GAP: 5 (ref 5–15)
BUN: 27 mg/dL — ABNORMAL HIGH (ref 6–20)
CALCIUM: 8.2 mg/dL — AB (ref 8.9–10.3)
CO2: 23 mmol/L (ref 22–32)
Chloride: 106 mmol/L (ref 101–111)
Creatinine, Ser: 0.88 mg/dL (ref 0.61–1.24)
GFR calc Af Amer: >60 mL/min (ref >60)
Glucose, Bld: 99 mg/dL (ref 65–99)
Potassium: 3.7 mmol/L (ref 3.5–5.1)
Sodium: 134 mmol/L — ABNORMAL LOW (ref 135–145)

## 2015-09-08 LAB — CBC
HCT: 19 % — ABNORMAL LOW (ref 40.0–52.0)
HEMOGLOBIN: 6.7 g/dL — AB (ref 13.0–18.0)
MCH: 28.8 pg (ref 26.0–34.0)
MCHC: 35.1 g/dL (ref 32.0–36.0)
MCV: 82.2 fL (ref 80.0–100.0)
PLATELETS: 194 10*3/uL (ref 150–440)
RBC: 2.31 MIL/uL — ABNORMAL LOW (ref 4.40–5.90)
RDW: 14.4 % (ref 11.5–14.5)
WBC: 7.7 10*3/uL (ref 3.8–10.6)

## 2015-09-08 LAB — MRSA PCR SCREENING: MRSA BY PCR: NEGATIVE

## 2015-09-08 LAB — PREPARE RBC (CROSSMATCH)

## 2015-09-08 LAB — TRANSFUSION REACTION
DAT C3: NEGATIVE
Post RXN DAT IgG: NEGATIVE

## 2015-09-08 LAB — OCCULT BLOOD X 1 CARD TO LAB, STOOL: FECAL OCCULT BLD: POSITIVE — AB

## 2015-09-08 LAB — HEMOGLOBIN: Hemoglobin: 6.6 g/dL — ABNORMAL LOW (ref 13.0–18.0)

## 2015-09-08 MED ORDER — PANTOPRAZOLE SODIUM 40 MG IV SOLR: 40.0000 mg | Freq: Two times a day (BID) | INTRAVENOUS | Status: DC

## 2015-09-08 MED ORDER — ONDANSETRON HCL 4 MG/2ML IJ SOLN
4.0000 mg | Freq: Four times a day (QID) | INTRAMUSCULAR | Status: DC | PRN
  Filled 2015-09-08: qty 2

## 2015-09-08 MED ORDER — ACETAMINOPHEN 650 MG RE SUPP: 650.0000 mg | Freq: Four times a day (QID) | RECTAL | Status: DC | PRN

## 2015-09-08 MED ORDER — SODIUM CHLORIDE 0.9 % IV SOLN
Freq: Once | INTRAVENOUS | Status: AC
  Administered 2015-09-08: 13:00:00 via INTRAVENOUS

## 2015-09-08 MED ORDER — ACETAMINOPHEN 325 MG PO TABS
650.0000 mg | ORAL_TABLET | Freq: Four times a day (QID) | ORAL | Status: DC | PRN
Start: 2015-09-08 — End: 2015-09-11
  Administered 2015-09-09 – 2015-09-11 (×2): 650 mg via ORAL
  Filled 2015-09-08 (×2): qty 2

## 2015-09-08 MED ORDER — PREGABALIN 75 MG PO CAPS
200.0000 mg | ORAL_CAPSULE | Freq: Three times a day (TID) | ORAL | Status: DC
  Administered 2015-09-08 – 2015-09-11 (×9): 200 mg via ORAL
  Filled 2015-09-08 (×11): qty 2

## 2015-09-08 MED ORDER — SODIUM CHLORIDE 0.9 % IV SOLN: Freq: Once | INTRAVENOUS | Status: DC

## 2015-09-08 MED ORDER — SODIUM CHLORIDE 0.9 % IV SOLN: INTRAVENOUS | Status: AC

## 2015-09-08 MED ORDER — TRAZODONE HCL 50 MG PO TABS
50.0000 mg | ORAL_TABLET | Freq: Every day | ORAL | Status: DC
  Administered 2015-09-08 – 2015-09-10 (×4): 50 mg via ORAL
  Filled 2015-09-08 (×4): qty 1

## 2015-09-08 MED ORDER — DONEPEZIL HCL 5 MG PO TABS
10.0000 mg | ORAL_TABLET | Freq: Every day | ORAL | Status: DC
  Administered 2015-09-09 – 2015-09-11 (×3): 10 mg via ORAL
  Filled 2015-09-08 (×3): qty 2

## 2015-09-08 MED ORDER — ONDANSETRON HCL 4 MG PO TABS: 4.0000 mg | ORAL_TABLET | Freq: Four times a day (QID) | ORAL | Status: DC | PRN

## 2015-09-08 MED ORDER — SODIUM CHLORIDE 0.9 % IV SOLN
8.0000 mg/h | INTRAVENOUS | Status: DC
  Administered 2015-09-08 – 2015-09-10 (×5): 8 mg/h via INTRAVENOUS
  Filled 2015-09-08 (×6): qty 80

## 2015-09-08 MED ORDER — SODIUM CHLORIDE 0.9 % IV SOLN
INTRAVENOUS | Status: AC
  Administered 2015-09-08: 01:00:00 via INTRAVENOUS

## 2015-09-08 MED ORDER — SODIUM CHLORIDE 0.9% FLUSH
3.0000 mL | Freq: Two times a day (BID) | INTRAVENOUS | Status: DC
  Administered 2015-09-08 – 2015-09-11 (×7): 3 mL via INTRAVENOUS

## 2015-09-08 NOTE — Consult Note (Signed)
Patient seen and examined, chart reviewed.  Please see full GI consult by Ms Daphine DeutscherMartin.   Patient with h/o dark/black stools since Sunday am for 2-3 days.  There was some emesis on Sunday am, no blood or coffeeground material at that time.  No emesis since.  Patietn with history of taking multiple nsaids daily for h/o chronic back pain.  Not on PPI at home.  No abdominal pain.  Small amount of stool passes an hour ago black.  No other stool since last night.   Questionable response to tfx yesterday.  No tfx reaction noted per chart.     Rectal exam is black and granular material, no maroon.  Likely older blood.    Currently hemodynamically stable and  Not tachycardic.   Plans noted for further tfx today.  Discussed with anesthesia.    Recommend continuing plans for tfx.  Will plan EGD for tomorrow afternoon, sooner if there is a change of clinical situation.  Continue current PPI drip.  Small amount of ice chjps ok.

## 2015-09-08 NOTE — Progress Notes (Signed)
First unit of blood completed. VSS.

## 2015-09-08 NOTE — Progress Notes (Signed)
Paged MD regarding HGB of 6.7 after 2 units of blood yesterday. Orders given for a STAT recheck. Orders put in.

## 2015-09-08 NOTE — Progress Notes (Signed)
Blood bank to notify RN if pt had a blood reaction. Reaction work up was negative. Blood bank is going to call MD to notify. Okay to start blood transfusion.

## 2015-09-08 NOTE — Progress Notes (Signed)
Initial Nutrition Assessment     INTERVENTION:  Monitor diet progression and tolerance    NUTRITION DIAGNOSIS:   Inadequate oral intake related to acute illness as evidenced by per patient/family report.    GOAL:   Patient will meet greater than or equal to 90% of their needs    MONITOR:   Diet advancement  REASON FOR ASSESSMENT:   Malnutrition Screening Tool    ASSESSMENT:   25 y/o male admitted with melena for the past 3 days.  GI consulted.   Past Medical History  Diagnosis Date  . Brain injury (HCC)   . 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    Nutrition-Focused physical exam completed. Findings are no fat depletion, mild in lower extremities muscle depletion, and no edema.   Lower extremity muscle loss expected with spinal cord injury   Diet Order:  Diet NPO time specified Except for: Sips with Meds  Skin:  Reviewed, no issues  Last BM:  4/18  Height:   Ht Readings from Last 1 Encounters:  09/08/15 5\' 10"  (1.778 m)    Weight: Pt reports wt loss of 8 pounds in the last 4 days (5% wt loss in 4 days),   Wt Readings from Last 1 Encounters:  09/08/15 141 lb 4.8 oz (64.093 kg)    Ideal Body Weight:     BMI:  Body mass index is 20.27 kg/(m^2).  Estimated Nutritional Needs:   Kcal:  [id number] kcals/d  Protein:  64-77 g/d  Fluid:  2 L/d  EDUCATION NEEDS:   No education needs identified at this time  Jesse Hirst B. Freida BusmanAllen, RD, LDN [phone] (pager) Weekend/On-Call pager [phone])

## 2015-09-08 NOTE — Progress Notes (Signed)
Blood Transfusion Reaction Investigation  Transfusion Reaction Investigation:   Patient's Clinical History:  25 year old male presents with lightheadedness and h/o dark/black stools since Sunday AM. Received 2 units of PRBC for hgb of 6.6. The clinician requested a transfusion reaction workup due to lack of increase in hgb.   Interpretation of Reaction:  Negative for transfusion reaction, lack of response likely due to active GI bleeding   Comments/Recommendations:  Transfuse until >7 or until symptoms of anemia resolve  Physician Contacted:  Dr. Sherryll BurgerShah Blood Transfusion Reaction (Nursing Documentation):   Not applicable Labs   Group/Rh:  A positive   DAT-Direct [place]:  Negative   Hemolysis- Pre/Post sample:  Not identified  Icterus-Pre/Post sample:  Not identified  Regan Lemmingara C Kym Scannell, MD 09/08/2015 5:18 PM

## 2015-09-08 NOTE — Consult Note (Signed)
GI Inpatient Consult Note  Reason for Consult:  Gastrointestinal Bleed   Attending Requesting Consult: Dr. Oralia Manisavid Willis  History of Present Illness: Devon Rice is a 25 y.o. male who presented to the Emergency Department on 09/07/15 for evaluation of bloody diarrhea stools.  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   He gives the history of waking up feeling funny on yesterday morning. He had an episode of vomiting and dark bloody diarrhea stool. States his initial vomitus looked like the barbecue sandwich he ate on Saturday night.  He vomited approximately four times all together without noticing blood in it. Last episode of vomiting last night.  He continued to have dark bloody diarrhea stools until 10 pm last night.  He feels that the left lower abdominal area is swollen. It is tender only when he touches the area.  Gastrointestinal Panel resulted negative.   He denies alcohol use. He has been taking Ibuprofen or Naproxen at least twice daily over the last ten days for chronic low back pain. No history of gastric ulcers or gastrointestinal bleeding.    His hemoglobin in Emergency Department was 6.6 gm/dl. He has received two units of PRBCs with a post-transfusion hemoglobin of 6.7.  Since there was no significant increase post- transfusion, his hemoglobin was repeated approximately 5 1/2 hours resulting in a level of 6.6. Orders to check for a transfusion reaction are being carried out.   Patient receiving a Protonix infusion and remains NPO for possible EGD. He denies any nausea, vomiting or abdominal pain. In review of his labs, his sodium is slightly low at 134. He is receiving normal saline with a repeat level ordered for the morning.    Past Medical History:  Past Medical History  Diagnosis Date  . Brain injury (HCC)   . Injury to L2 level of spinal cord [facility](HCC)     Problem List: Patient Active Problem List   Diagnosis  Date Noted  . GI bleed 09/07/2015  . History of traumatic brain injury 09/07/2015  . Leukocytosis 09/07/2015  . Blood loss anemia 09/07/2015    Past Surgical History: Past Surgical History  Procedure Laterality Date  . Back surgery    . Brain surgery      Allergies: No Known Allergies  Home Medications: Prescriptions prior to admission  Medication Sig Dispense Refill Last Dose  . diphenhydramine-acetaminophen (TYLENOL PM) 25-500 MG TABS tablet Take 2 tablets by mouth at bedtime as needed (for sleep/pain).   09/06/2015 at 0000  . donepezil (ARICEPT) 10 MG tablet Take 10 mg by mouth at bedtime.   09/06/2015 at Unknown time  . ibuprofen (ADVIL,MOTRIN) 200 MG tablet Take 200-400 mg by mouth every 6 (six) hours as needed for headache or mild pain.   Past Week at Unknown time  . pregabalin (LYRICA) 200 MG capsule Take 200 mg by mouth 3 (three) times daily.   09/07/2015 at Unknown time  . pregabalin (LYRICA) 200 MG capsule Take 200 mg by mouth 2 (two) times daily.     . traZODone (DESYREL) 50 MG tablet Take 50 mg by mouth at bedtime.   09/06/2015 at Unknown time   Home medication reconciliation was completed with the patient.   Scheduled Inpatient Medications:   . donepezil  10 mg Oral QHS  . [START ON 09/11/2015] pantoprazole (PROTONIX) IV  40 mg Intravenous Q12H  . pregabalin  200 mg Oral TID  . sodium chloride  flush  3 mL Intravenous Q12H  . traZODone  50 mg Oral QHS    Continuous Inpatient Infusions:   . sodium chloride    . pantoprozole (PROTONIX) infusion 8 mg/hr (09/08/15 1257)    PRN Inpatient Medications:  acetaminophen **OR** acetaminophen, ondansetron **OR** ondansetron (ZOFRAN) IV  Family History: family history is not on file.  The patient's family history is negative for inflammatory bowel disorders, GI malignancy, or solid organ transplantation.  Social History:   reports that he has never smoked. He does not have any smokeless tobacco history on file. He reports  that he does not drink alcohol or use illicit drugs. The patient denies ETOH, tobacco, or drug use.    Review of Systems: Constitutional: Weight is stable.  Eyes: No changes in vision. ENT: No oral lesions, sore throat.  GI: see HPI.  Heme/Lymph: No easy bruising.  CV: No chest pain.  GU: No hematuria.  Integumentary: No rashes.  Neuro: No headaches.  Psych: No depression/anxiety.  Endocrine: No heat/cold intolerance.  Allergic/Immunologic: No urticaria.  Resp: No cough, SOB.  Musculoskeletal: No joint swelling.    Physical Examination: BP 113/66 mmHg  Pulse 92  Temp(Src) 98.4 F (36.9 C) (Oral)  Resp 20  Ht  (1.778 m)  Wt 64.093 kg (141 lb 4.8 oz)  BMI 20.27 kg/m2  SpO2 100% Gen: NAD, alert and oriented x 4 HEENT: EOMI, sclera anicteric Neck: supple, no JVD or thyromegaly Chest: CTA bilaterally, no wheezes, crackles, or other adventitious sounds CV: RRR, no m/g/c/r Abd: soft, NT, ND, +BS in all four quadrants; no HSM, guarding, ridigity, or rebound tenderness Rectal: Rectal exam normal with the exception of dark brownish red stool noted in rectal vault. Occult obtain and sent to lab.  Ext: no edema, well perfused with 2+ pulses, Skin: no rash or lesions noted Lymph: no LAD  Data: Lab Results  Component Value Date   WBC 7.7 09/08/2015   HGB 6.6* 09/08/2015   HCT 19.0* 09/08/2015   MCV 82.2 09/08/2015   PLT 194 09/08/2015    Recent Labs Lab 09/07/15 2001 09/08/15 0539 09/08/15 1058  HGB 6.7* 6.7* 6.6*   Lab Results  Component Value Date   NA 134* 09/08/2015   K 3.7 09/08/2015   CL 106 09/08/2015   CO2 23 09/08/2015   BUN 27* 09/08/2015   CREATININE 0.88 09/08/2015   Lab Results  Component Value Date   ALT 26 09/07/2015   AST 39 09/07/2015   ALKPHOS 51 09/07/2015   BILITOT 0.6 09/07/2015   No results for input(s): APTT, INR, PTT in the last 168 hours. Assessment/Plan: [name] is a 25 y.o. male with an Acute Gastrointestinal bleed that  maybe related to his chronic use of NSAIDS.  An EGD has been recommended to assess for Gastritis, Duodenitis or PUD.    He also has been diagnosed with acute post-hemorrhagic Anemia s/p 2 units of packed red blood cells with his hemoglobin remaining low at 6.6 gm/dl. He is to receive 2 more units of PRBCs today per Dr. Sherryll Burger.   Recommendations:  EGD as noted above when clinically stable. Continue with Protonix infusion and NPO.   Thank you for the consult. Please call with questions or concerns.  Patient seen with Dr. Barnetta Chapel  under collaborative agreement.  Daryll Drown, FNP

## 2015-09-08 NOTE — Progress Notes (Addendum)
Per Dr. Sherryll BurgerShah, only wants to transfuse 2 units of blood. Not 3 units.

## 2015-09-08 NOTE — Progress Notes (Signed)
Spoke with dr. pyreddy about results from stool panel. We can not test for c-diff in stool r/t GIB, since stool pcr negative may d/c enteric precautions.

## 2015-09-08 NOTE — Progress Notes (Signed)
[facility] [place] [facility]   PATIENT NAME: Devon KeensBrian Rice    MR#:  [id number]  DATE OF BIRTH:  24-Dec-1990  SUBJECTIVE: I feel better today  CHIEF COMPLAINT:  No chief complaint on file.  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 On arrival to the hospital. Patient's hemoglobin level was found to be 6.7, after transfusion of packed blood cells, his hemoglobin remains low at 6.6. Patient's blood pressure remains stable. Patient denies any melena since 10 PM last night. Denies any abdominal pain, nausea or vomiting. Patient is nothing by mouth, he is receiving Protonix IV drip. Apparently the patient was on nonsteroidal anti-inflammatory medications for chronic back pain . He is on the maximum dose of Lyrica for neuropathic pain. Gastrointestinal consultation is pending.   Review of Systems  Constitutional: Negative for fever, chills and weight loss.  HENT: Negative for congestion.   Eyes: Negative for blurred vision and double vision.  Respiratory: Negative for cough, sputum production, shortness of breath and wheezing.   Cardiovascular: Negative for chest pain, palpitations, orthopnea, leg swelling and PND.  Gastrointestinal: Negative for nausea, vomiting, abdominal pain, diarrhea, constipation and blood in stool.  Genitourinary: Negative for dysuria, urgency, frequency and hematuria.  Musculoskeletal: Positive for back pain. Negative for falls.  Neurological: Positive for tingling and sensory change. Negative for dizziness, tremors, focal weakness and headaches.  Endo/Heme/Allergies: Does not bruise/bleed easily.  Psychiatric/Behavioral: Negative for depression. The patient does not have insomnia.     VITAL  SIGNS: Blood pressure 113/66, pulse 92, temperature 98.4 F (36.9 C), temperature source Oral, resp. rate 20, height 5\' 10"  (1.778 m), weight 64.093 kg (141 lb 4.8 oz), SpO2 100 %.  PHYSICAL EXAMINATION:   GENERAL:  25 y.o.-year-old patient lying in the bed with no acute distress. Pale EYES: Pupils equal, round, reactive to light and accommodation. No scleral icterus. Extraocular muscles intact.  HEENT: Head atraumatic, normocephalic. Oropharynx and nasopharynx clear.  NECK:  Supple, no jugular venous distention. No thyroid enlargement, no tenderness.  LUNGS: Normal breath sounds bilaterally, no wheezing, rales,rhonchi or crepitation. No use of accessory muscles of respiration.  CARDIOVASCULAR: S1, S2 normal. No murmurs, rubs, or gallops.  ABDOMEN: Soft, nontender, nondistended. Bowel sounds present. No organomegaly or mass.  EXTREMITIES: No pedal edema, cyanosis, or clubbing. Lower extremity contractures NEUROLOGIC: Cranial nerves II through XII are intact. Muscle strength 5/5 in all extremities. Sensation intact. Gait not checked.  PSYCHIATRIC: The patient is alert and oriented x 3.  SKIN: No obvious rash, lesion, or ulcer.   ORDERS/RESULTS REVIEWED:   CBC  Recent Labs Lab 09/07/15 2001 09/08/15 0539 09/08/15 1058  WBC 13.9* 7.7  --   HGB 6.7* 6.7* 6.6*  HCT 19.0* 19.0*  --   PLT 406 194  --   MCV 85.5 82.2  --   MCH 30.2 28.8  --   MCHC 35.4 35.1  --   RDW 13.4 14.4  --   LYMPHSABS 3.4  --   --   MONOABS 1.5*  --   --   EOSABS 0.0  --   --   BASOSABS 0.0  --   --    ------------------------------------------------------------------------------------------------------------------  Chemistries   Recent Labs Lab 09/07/15 2001 09/08/15 0539  NA 136 134*  K 3.5 3.7  CL 102 106  CO2 22 23  GLUCOSE 156* 99  BUN 34* 27*  CREATININE 0.97 0.88  CALCIUM 9.1 8.2*  AST 39  --   ALT 26  --   ALKPHOS 51  --   BILITOT 0.6  --     ------------------------------------------------------------------------------------------------------------------ estimated creatinine clearance is 117.4 mL/min (by C-G formula based on Cr of 0.88). ------------------------------------------------------------------------------------------------------------------ No results for input(s): TSH, T4TOTAL, T3FREE, THYROIDAB in the last 72 hours.  Invalid input(s): FREET3  Cardiac Enzymes No results for input(s): CKMB, TROPONINI, MYOGLOBIN in the last 168 hours.  Invalid input(s): CK ------------------------------------------------------------------------------------------------------------------ Invalid input(s): POCBNP ---------------------------------------------------------------------------------------------------------------  RADIOLOGY: No results found.  EKG: No orders found for this or any previous visit.  ASSESSMENT AND PLAN:  Principal Problem:   GI bleed Active Problems:   History of traumatic brain injury   Leukocytosis   Blood loss anemia  #1. Acute posthemorrhagic anemia, transfused 2 more units of packed red blood cells. Following hemoglobin level later in the day, transfuse as needed #2. Gastrointestinal bleed, likely due to nonsteroidal anti-inflammatory medications, continue Protonix IV drip, gastroesophageal consultation is requested, likely EGD during this admission, keep patient nothing by mouth, continue IV fluids. #3. Hyponatremia, advance IV fluids and follow sodium level in the morning  #4. Leukocytosis, resolved, no infection noted, likely stress related  Management plans discussed with the patient, family and they are in agreement.   DRUG ALLERGIES: No Known Allergies  CODE STATUS:     Code Status Orders        Start     Ordered   09/08/15 0112  Full code   Continuous     09/08/15 0111    Code Status History    Date Active Date Inactive Code Status Order ID Comments User Context   This  patient has a current code status but no historical code status.      TOTAL critical care TIME TAKING CARE OF THIS PATIENT: 40 minutes.    Katharina Caper M.D on 09/08/2015 at 12:05 PM  Between 7am to 6pm - Pager - [phone]  After 6pm go to [email or web address] - password [email or web address] [facility]  [facility]  Office  [phone]  CC: Primary care physician; No primary care provider on file.

## 2015-09-09 ENCOUNTER — Inpatient Hospital Stay: Payer: PRIVATE HEALTH INSURANCE | Admitting: Anesthesiology

## 2015-09-09 ENCOUNTER — Encounter
Admission: EM | Disposition: A | Discharge status: Home / Self Care | Payer: Self-pay | Source: Home / Self Care | Attending: Internal Medicine

## 2015-09-09 ENCOUNTER — Encounter: Payer: Self-pay | Admitting: Anesthesiology

## 2015-09-09 HISTORY — PX: ESOPHAGOGASTRODUODENOSCOPY: SHX5428

## 2015-09-09 LAB — HEMOGLOBIN AND HEMATOCRIT, BLOOD
HCT: 29.9 % — ABNORMAL LOW (ref 40.0–52.0)
HEMOGLOBIN: 8.4 g/dL — AB (ref 13.0–18.0)

## 2015-09-09 SURGERY — EGD (ESOPHAGOGASTRODUODENOSCOPY)
Anesthesia: General

## 2015-09-09 SURGERY — ESOPHAGOGASTRODUODENOSCOPY (EGD) WITH PROPOFOL
Anesthesia: General

## 2015-09-09 MED ORDER — LIDOCAINE HCL (PF) 2 % IJ SOLN
INTRAMUSCULAR | Status: DC | PRN
  Administered 2015-09-09: 60 mg via INTRADERMAL

## 2015-09-09 MED ORDER — POTASSIUM CHLORIDE IN NACL 20-0.9 MEQ/L-% IV SOLN
INTRAVENOUS | Status: DC
  Administered 2015-09-09 – 2015-09-10 (×3): via INTRAVENOUS
  Filled 2015-09-09 (×4): qty 1000

## 2015-09-09 MED ORDER — PROPOFOL 10 MG/ML IV BOLUS
INTRAVENOUS | Status: DC | PRN
  Administered 2015-09-09: 60 mg via INTRAVENOUS

## 2015-09-09 MED ORDER — PROPOFOL 500 MG/50ML IV EMUL
INTRAVENOUS | Status: DC | PRN
  Administered 2015-09-09: 150 ug/kg/min via INTRAVENOUS

## 2015-09-09 MED ORDER — TRAMADOL HCL 50 MG PO TABS
50.0000 mg | ORAL_TABLET | Freq: Four times a day (QID) | ORAL | Status: DC | PRN
Start: 2015-09-09 — End: 2015-09-11
  Administered 2015-09-09 – 2015-09-11 (×5): 50 mg via ORAL
  Filled 2015-09-09 (×5): qty 1

## 2015-09-09 NOTE — Progress Notes (Signed)
[facility] [place] [facility]   PATIENT NAME: Devon KeensBrian Heacox    MR#:  [id number]  DATE OF BIRTH:  [birth year]  SUBJECTIVE: I feel better today  CHIEF COMPLAINT:  No chief complaint on file.  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 On arrival to the hospital. Patient's hemoglobin level was found to be 6.7, after transfusion of packed blood cells, his hemoglobin remains low at 6.6. Patient's blood pressure remains stable. Patient denies any melena since 10 PM last night. Denies any abdominal pain, nausea or vomiting. Patient is nothing by mouth, he is receiving Protonix IV drip. Apparently the patient was on nonsteroidal anti-inflammatory medications for chronic back pain . He is on the maximum dose of Lyrica for neuropathic pain. Gastroenterologist recommended EGD, to be performed today. The patient feels good today, denies any melena, admits of stool today, although not as black as it was before No abdominal pain, no nausea, vomiting. Patient is nothing by mouth for EGD   Review of Systems  Constitutional: Negative for fever, chills and weight loss.  HENT: Negative for congestion.   Eyes: Negative for blurred vision and double vision.  Respiratory: Negative for cough, sputum production, shortness of breath and wheezing.   Cardiovascular: Negative for chest pain, palpitations, orthopnea, leg swelling and PND.  Gastrointestinal: Negative for nausea, vomiting, abdominal pain, diarrhea, constipation and blood in stool.  Genitourinary: Negative for dysuria, urgency, frequency and hematuria.  Musculoskeletal: Positive for back pain. Negative for falls.  Neurological: Positive for tingling and sensory change. Negative for  dizziness, tremors, focal weakness and headaches.  Endo/Heme/Allergies: Does not bruise/bleed easily.  Psychiatric/Behavioral: Negative for depression. The patient does not have insomnia.     VITAL SIGNS: Blood pressure 111/65, pulse 69, temperature 97.9 F (36.6 C), temperature source Oral, resp. rate 16, height 5\' 10"  (1.778 m), weight 64.093 kg (141 lb 4.8 oz), SpO2 100 %.  PHYSICAL EXAMINATION:   GENERAL:  25 y.o.-year-old patient lying in the bed with no acute distress. Pink complexion EYES: Pupils equal, round, reactive to light and accommodation. No scleral icterus. Extraocular muscles intact.  HEENT: Head atraumatic, normocephalic. Oropharynx and nasopharynx clear.  NECK:  Supple, no jugular venous distention. No thyroid enlargement, no tenderness.  LUNGS: Normal breath sounds bilaterally, no wheezing, rales,rhonchi or crepitation. No use of accessory muscles of respiration.  CARDIOVASCULAR: S1, S2 normal. No murmurs, rubs, or gallops.  ABDOMEN: Soft, nontender, nondistended. Bowel sounds present. No organomegaly or mass.  EXTREMITIES: No pedal edema, cyanosis, or clubbing. Lower extremity contractures NEUROLOGIC: Cranial nerves II through XII are intact. Muscle strength 5/5 in all extremities. Sensation intact. Gait not checked.  PSYCHIATRIC: The patient is alert and oriented x 3.  SKIN: No obvious rash, lesion, or ulcer.   ORDERS/RESULTS REVIEWED:   CBC  Recent Labs Lab 09/07/15 2001 09/08/15 0539 09/08/15 1058 09/09/15 0331  WBC 13.9* 7.7  --   --   HGB 6.7* 6.7* 6.6* 8.4*  HCT 19.0* 19.0*  --  29.9*  PLT 406 194  --   --   MCV 85.5 82.2  --   --   MCH 30.2 28.8  --   --   MCHC 35.4 35.1  --   --  RDW 13.4 14.4  --   --   LYMPHSABS 3.4  --   --   --   MONOABS 1.5*  --   --   --   EOSABS 0.0  --   --   --   BASOSABS 0.0  --   --   --     ------------------------------------------------------------------------------------------------------------------  Chemistries   Recent Labs Lab 09/07/15 2001 09/08/15 0539  NA 136 134*  K 3.5 3.7  CL 102 106  CO2 22 23  GLUCOSE 156* 99  BUN 34* 27*  CREATININE 0.97 0.88  CALCIUM 9.1 8.2*  AST 39  --   ALT 26  --   ALKPHOS 51  --   BILITOT 0.6  --    ------------------------------------------------------------------------------------------------------------------ estimated creatinine clearance is 117.4 mL/min (by C-G formula based on Cr of 0.88). ------------------------------------------------------------------------------------------------------------------ No results for input(s): TSH, T4TOTAL, T3FREE, THYROIDAB in the last 72 hours.  Invalid input(s): FREET3  Cardiac Enzymes No results for input(s): CKMB, TROPONINI, MYOGLOBIN in the last 168 hours.  Invalid input(s): CK ------------------------------------------------------------------------------------------------------------------ Invalid input(s): POCBNP ---------------------------------------------------------------------------------------------------------------  RADIOLOGY: No results found.  EKG: No orders found for this or any previous visit.  ASSESSMENT AND PLAN:  Principal Problem:   GI bleed Active Problems:   History of traumatic brain injury   Leukocytosis   Blood loss anemia  #1. Acute posthemorrhagic anemia, as is post packed red blood cell transfusion, hemoglobin level has improved. Following hemoglobin level in the morning, no active bleeding noted  #2. Gastrointestinal bleed, likely due to nonsteroidal anti-inflammatory medications, continue Protonix IV drip, gastroenterology recommends EGD today, continue IV fluids, periprocedure while the patient is nothing by mouth. #3. Hyponatremia, continue IV fluids and follow sodium level in the morning  #4. Leukocytosis, resolved, no infection  noted, likely stress related  Management plans discussed with the patient, family and they are in agreement.   DRUG ALLERGIES: No Known Allergies  CODE STATUS:     Code Status Orders        Start     Ordered   09/08/15 0112  Full code   Continuous     09/08/15 0111    Code Status History    Date Active Date Inactive Code Status Order ID Comments User Context   This patient has a current code status but no historical code status.      TOTAL critical care TIME TAKING CARE OF THIS PATIENT: 30 minutes.    Katharina Caper M.D on 09/09/2015 at 1:39 PM  Between 7am to 6pm - Pager - [phone]  After 6pm go to [email or web address] - password [email or web address] [facility]  [place] [facility]  Office  [phone]  CC: Primary care physician; No primary care provider on file.

## 2015-09-09 NOTE — Consult Note (Signed)
Subjective: Patient seen for melena. Patient denies nausea or emesis, no bm since yesterday.   No abdominal pain.   Objective: Vital signs in last 24 hours: Temp:  [97.4 F (36.3 C)-98.6 F (37 C)] 97.9 F (36.6 C) (04/20 1223) Pulse Rate:  [67-84] 69 (04/20 1223) Resp:  [10-17] 16 (04/20 1223) BP: (102-139)/(61-83) 111/65 mmHg (04/20 1223) SpO2:  [100 %] 100 % (04/20 1223) Blood pressure 111/65, pulse 69, temperature 97.9 F (36.6 C), temperature source Oral, resp. rate 16, height 5\' 10"  (1.778 m), weight 64.093 kg (141 lb 4.8 oz), SpO2 100 %.   Intake/Output from previous day: 04/19 0701 - 04/20 0700 In: 2299.4 [I.V.:1593.4; Blood:706] Out: 200 [Urine:200]  Intake/Output this shift:     General appearance:  Well appearing m no distress Resp:  bcta Cardio:  rrr GI:  Soft, nontender, nondistended, bowel sounds positive normoactive.  Extremities:  No cce   Lab Results: Results for orders placed or performed during the hospital encounter of 09/07/15 (from the past 24 hour(s))  Hemoglobin and hematocrit, blood     Status: Abnormal   Collection Time: 09/09/15  3:31 AM  Result Value Ref Range   Hemoglobin 8.4 (L) 13.0 - 18.0 g/dL   HCT 16.129.9 (L) 09.640.0 - 04.552.0 %      Recent Labs  09/07/15 2001 09/08/15 0539 09/08/15 1058 09/09/15 0331  WBC 13.9* 7.7  --   --   HGB 6.7* 6.7* 6.6* 8.4*  HCT 19.0* 19.0*  --  29.9*  PLT 406 194  --   --    BMET  Recent Labs  09/07/15 2001 09/08/15 0539  NA 136 134*  K 3.5 3.7  CL 102 106  CO2 22 23  GLUCOSE 156* 99  BUN 34* 27*  CREATININE 0.97 0.88  CALCIUM 9.1 8.2*   LFT  Recent Labs  09/07/15 2001  PROT 6.5  ALBUMIN 4.4  AST 39  ALT 26  ALKPHOS 51  BILITOT 0.6   PT/INR No results for input(s): LABPROT, INR in the last 72 hours. Hepatitis Panel No results for input(s): HEPBSAG, HCVAB, HEPAIGM, HEPBIGM in the last 72 hours. C-Diff No results for input(s): CDIFFTOX in the last 72 hours. No results for input(s):  CDIFFPCR in the last 72 hours.   Studies/Results: No results found.  Scheduled Inpatient Medications:   . [MAR Hold] donepezil  10 mg Oral QHS  . [MAR Hold] pantoprazole (PROTONIX) IV  40 mg Intravenous Q12H  . [MAR Hold] pregabalin  200 mg Oral TID  . [MAR Hold] sodium chloride flush  3 mL Intravenous Q12H  . [MAR Hold] traZODone  50 mg Oral QHS    Continuous Inpatient Infusions:   . 0.9 % NaCl with KCl 20 mEq / L 100 mL/hr at 09/09/15 1158  . pantoprozole (PROTONIX) infusion 8 mg/hr (09/09/15 1225)    PRN Inpatient Medications:  [MAR Hold] acetaminophen **OR** [MAR Hold] acetaminophen, [MAR Hold] ondansetron **OR** [MAR Hold] ondansetron (ZOFRAN) IV, [MAR Hold] traMADol  Miscellaneous:   Assessment:  1) melena in the setting of daily nsaid use. Appropriate response to sencon set of tfx.    Plan:  1) egd. I have discussed the risks benefits and complications of procedures to include not limited to bleeding, infection, perforation and the risk of sedation and the patient wishes to proceed.  Christena DeemMartin U Makisha Marrin MD 09/09/2015, 1:30 PM

## 2015-09-09 NOTE — Transfer of Care (Signed)
Immediate Anesthesia Transfer of Care Note  Patient: Devon KeensBrian Rice  Procedure(s) Performed: Procedure(s): ESOPHAGOGASTRODUODENOSCOPY (EGD) (N/A)  Patient Location: PACU  Anesthesia Type:General  Level of Consciousness: awake, alert  and responds to stimulation  Airway & Oxygen Therapy: Patient Spontanous Breathing and Patient connected to nasal cannula oxygen  Post-op Assessment: Report given to RN and Post -op Vital signs reviewed and stable  Post vital signs: Reviewed and stable  Last Vitals:  Filed Vitals:   09/09/15 1337 09/09/15 1423  BP:  92/80  Pulse: 68 91  Temp: 35.9 C   Resp: 16 17    Complications: No apparent anesthesia complications

## 2015-09-09 NOTE — Op Note (Signed)
[facility] Patient Name: Devon Rice Procedure Date: 09/09/2015 2:01 PM MRN: [id number] Account #: [id number] Date of Birth: 1991/05/13 Admit Type: Outpatient Age: 25 Room: [facility] ENDO ROOM 1 Gender: Male Note Status: Finalized Procedure:            Upper GI endoscopy Indications:          Melena Providers:            Christena Deem, MD Medicines:            Monitored Anesthesia Care Complications:        No immediate complications. Procedure:            Pre-Anesthesia Assessment:                       - ASA Grade Assessment: III - A patient with severe                        systemic disease.                       After obtaining informed consent, the endoscope was                        passed under direct vision. Throughout the procedure,                        the patient's blood pressure, pulse, and oxygen                        saturations were monitored continuously. The Endoscope                        was introduced through the mouth, and advanced to the                        second part of duodenum. The upper GI endoscopy was                        accomplished without difficulty. The patient tolerated                        the procedure well. Findings:      LA Grade B (one or more mucosal breaks greater than 5 mm, not extending       between the tops of two mucosal folds) esophagitis with no bleeding was       found.      A small hiatal hernia was found. The Z-line was a variable distance from       incisors; the hiatal hernia was sliding.      Patchy moderate inflammation characterized by congestion (edema),       erosions, erythema and shallow ulcerations was found at the incisura, in       the gastric antrum and in the prepyloric region of the stomach.      The cardia and gastric fundus were normal on retroflexion.      The exam of the stomach was otherwise normal.      One non-obstructing non-bleeding cratered duodenal  ulcer with pigmented       material was found in the posterior duodenal bulb. The lesion was 4 mm  in largest dimension. The scope was not passed beyond the ulcer.      The cardia and gastric fundus were normal on retroflexion. Impression:           - LA Grade B erosive esophagitis.                       - Small hiatal hernia.                       - Erosive gastritis.                       - One non-obstructing non-bleeding duodenal ulcer with                        pigmented material. Suspicious for NSAID induced                        etiology.                       - No specimens collected. Recommendation:       - Give Protonix (pantoprazole): 8 mg/hr IV by                        continuous infusion for 1 day.                       - Use Protonix (pantoprazole) 40 mg IV BID for 1 day.                       - Use Protonix (pantoprazole) 40 mg PO BID for 1 month.                       - Use Protonix (pantoprazole) 40 mg PO daily                        indefinitely. Procedure Code(s):    --- Professional ---                       [phone], Esophagogastroduodenoscopy, flexible, transoral;                        diagnostic, including collection of specimen(s) by                        brushing or washing, when performed (separate procedure) Diagnosis Code(s):    --- Professional ---                       K20.8, Other esophagitis                       K44.9, Diaphragmatic hernia without obstruction or                        gangrene                       K29.60, Other gastritis without bleeding                       K26.9, Duodenal ulcer, unspecified as acute or chronic,  without hemorrhage or perforation                       K92.1, Melena (includes Hematochezia) CPT copyright 2016 American [employer]. All rights reserved. The codes documented in this report are preliminary and upon coder review may  be revised to meet current compliance  requirements. Christena Deem, MD 09/09/2015 2:24:47 PM This report has been signed electronically. Number of Addenda: 0 Note Initiated On: 09/09/2015 2:01 PM      [facility]

## 2015-09-09 NOTE — Consult Note (Signed)
Please see full EGD report.  Ulcer with stigmata of recent bleeding in the posterior duodenal bulb.  No active bleeding.   Multiple erosions/early ulceration in the distal antrum and incusura region.   Evidence of previous PUDz.   No active bleeding throughout.   Likely nsaid related, however will check h pylori serology.    Continue iv ppi drip through tomorrow, then 40 mg protonix iv bid for another day.  Then switch to bid po, continue for a  Month bid, then continue daily indefinately.  NO nsaids.  GI o/p fu in 3-4 weeks.    May start limited clears today, advance to clears tomorrow then full liquids for 3 days before starting soft diet for 10 more days, then regular.

## 2015-09-09 NOTE — Anesthesia Preprocedure Evaluation (Addendum)
Anesthesia Evaluation  Patient identified by MRN, date of birth, ID band Patient awake    Reviewed: Allergy & Precautions, NPO status , Patient's Chart, lab work & pertinent test results, reviewed documented beta blocker date and time   Airway Mallampati: II  TM Distance: >3 FB     Dental  (+) Chipped, Poor Dentition, Missing   Pulmonary           Cardiovascular      Neuro/Psych    GI/Hepatic   Endo/Other    Renal/GU      Musculoskeletal   Abdominal   Peds  Hematology  (+) anemia ,   Anesthesia Other Findings Poor dentition, broken teeth. 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 Anemic with Hb 8.4. L2 injury. Can walk with leg braces.  Reproductive/Obstetrics                            Anesthesia Physical Anesthesia Plan  ASA: III  Anesthesia Plan: General   Post-op Pain Management:    Induction: Intravenous  Airway Management Planned: Nasal Cannula  Additional Equipment:   Intra-op Plan:   Post-operative Plan:   Informed Consent: I have reviewed the patients History and Physical, chart, labs and discussed the procedure including the risks, benefits and alternatives for the proposed anesthesia with the patient or authorized representative who has indicated his/her understanding and acceptance.     Plan Discussed with: CRNA  Anesthesia Plan Comments:         Anesthesia Quick Evaluation

## 2015-09-10 LAB — H PYLORI, IGM, IGG, IGA AB
H Pylori IgG: <0.9 U/mL (ref 0.0–0.8)
H. Pylogi, Igm Abs: <9 units (ref 0.0–8.9)

## 2015-09-10 LAB — SODIUM: SODIUM: 139 mmol/L (ref 135–145)

## 2015-09-10 LAB — HEMOGLOBIN: Hemoglobin: 8.5 g/dL — ABNORMAL LOW (ref 13.0–18.0)

## 2015-09-10 MED ORDER — PANTOPRAZOLE SODIUM 40 MG IV SOLR
40.0000 mg | Freq: Two times a day (BID) | INTRAVENOUS | Status: DC
  Administered 2015-09-10 – 2015-09-11 (×3): 40 mg via INTRAVENOUS
  Filled 2015-09-10 (×4): qty 40

## 2015-09-10 NOTE — Consult Note (Signed)
Subjective: Patient seen for ugib.  Patietn doing well, no recurrent emesis or repeat bleeding.  No abdominal pain.    Objective: Vital signs in last 24 hours: Temp:  [97.6 F (36.4 C)-98.2 F (36.8 C)] 98.2 F (36.8 C) (04/21 1349) Pulse Rate:  [55-70] 55 (04/21 1349) Resp:  [9-17] 16 (04/21 1349) BP: (109-119)/(67-77) 114/68 mmHg (04/21 1349) SpO2:  [100 %] 100 % (04/21 1349) Blood pressure 114/68, pulse 55, temperature 98.2 F (36.8 C), temperature source Oral, resp. rate 16, height 5\' 10"  (1.778 m), weight 64.093 kg (141 lb 4.8 oz), SpO2 100 %.   Intake/Output from previous day: 04/20 0701 - 04/21 0700 In: 2967.6 [P.O.:900; I.V.:2067.6] Out: -   Intake/Output this shift: Total I/O In: 760 [P.O.:760] Out: -    General appearance:  Well appearing [age] male no distress. Resp:  bcta Cardio:  rrr GI:  Soft, nontender nondistended, bowel sounds positive normoactive.  Extremities:  No cce.   Lab Results: Results for orders placed or performed during the hospital encounter of 09/07/15 (from the past 24 hour(s))  Hemoglobin     Status: Abnormal   Collection Time: 09/10/15  4:49 AM  Result Value Ref Range   Hemoglobin 8.5 (L) 13.0 - 18.0 g/dL  Sodium     Status: None   Collection Time: 09/10/15  4:49 AM  Result Value Ref Range   Sodium 139 135 - 145 mmol/L      Recent Labs  09/07/15 2001 09/08/15 0539 09/08/15 1058 09/09/15 0331 09/10/15 0449  WBC 13.9* 7.7  --   --   --   HGB 6.7* 6.7* 6.6* 8.4* 8.5*  HCT 19.0* 19.0*  --  29.9*  --   PLT 406 194  --   --   --    BMET  Recent Labs  09/07/15 2001 09/08/15 0539 09/10/15 0449  NA 136 134* 139  K 3.5 3.7  --   CL 102 106  --   CO2 22 23  --   GLUCOSE 156* 99  --   BUN 34* 27*  --   CREATININE 0.97 0.88  --   CALCIUM 9.1 8.2*  --    LFT  Recent Labs  09/07/15 2001  PROT 6.5  ALBUMIN 4.4  AST 39  ALT 26  ALKPHOS 51  BILITOT 0.6   PT/INR No results for input(s): LABPROT, INR in the last 72  hours. Hepatitis Panel No results for input(s): HEPBSAG, HCVAB, HEPAIGM, HEPBIGM in the last 72 hours. C-Diff No results for input(s): CDIFFTOX in the last 72 hours. No results for input(s): CDIFFPCR in the last 72 hours.   Studies/Results: No results found.  Scheduled Inpatient Medications:   . donepezil  10 mg Oral QHS  . pantoprazole (PROTONIX) IV  40 mg Intravenous Q12H  . pregabalin  200 mg Oral TID  . sodium chloride flush  3 mL Intravenous Q12H  . traZODone  50 mg Oral QHS    Continuous Inpatient Infusions:   . 0.9 % NaCl with KCl 20 mEq / L 100 mL/hr at 09/10/15 0856    PRN Inpatient Medications:  acetaminophen **OR** acetaminophen, ondansetron **OR** ondansetron (ZOFRAN) IV, traMADol  Miscellaneous:   Assessment:  1) melena-DU on egd with sigmata of recent bleeding.  No repeat.  HGB stable.    Plan:  1) continue current.  Slow advancement of diet requested due to location of DU.  Continue bid ppi as o/p.  GI fu in 2-3 weeks.  Dr Zachery DauerBarnes covering  over the weekend if needed.   Christena Deem MD 09/10/2015, 2:40 PM

## 2015-09-10 NOTE — Anesthesia Postprocedure Evaluation (Signed)
Anesthesia Post Note  Patient: Devon KeensBrian Rice  Procedure(s) Performed: Procedure(s) (LRB): ESOPHAGOGASTRODUODENOSCOPY (EGD) (N/A)  Patient location during evaluation: Endoscopy Anesthesia Type: General Level of consciousness: awake and alert Pain management: pain level controlled Vital Signs Assessment: post-procedure vital signs reviewed and stable Respiratory status: spontaneous breathing, nonlabored ventilation, respiratory function stable and patient connected to nasal cannula oxygen Cardiovascular status: blood pressure returned to baseline and stable Postop Assessment: no signs of nausea or vomiting Anesthetic complications: no    Last Vitals:  Filed Vitals:   09/10/15 0511 09/10/15 1349  BP: 111/73 114/68  Pulse: 70 55  Temp: 36.4 C 36.8 C  Resp: 16 16    Last Pain:  Filed Vitals:   09/10/15 1349  PainSc: 1                  Orion Vandervort S

## 2015-09-10 NOTE — Progress Notes (Signed)
[facility] - [place] at [facility]   PATIENT NAME: Devon Rice    MR#:  [id number]  DATE OF BIRTH:  14-Apr-1991  SUBJECTIVE: I feel better today  CHIEF COMPLAINT:  No chief complaint on file.  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 On arrival to the hospital. Patient's hemoglobin level was found to be 6.7, the patient had transfusion of packed blood cells, his hemoglobin improved. Patient's blood pressure remains stable. Patient denies any melena .  Denies any abdominal pain, nausea or vomiting. Patient underwent EGD 20th of April 2017, revealing duodenal bulb ulcer with recent signs of bleeding. The patient was initiated on clear liquid diet, recommended to advance diet very slowly. Patient feels good today. Denies any pain, denies any melena or nausea. Her hemoglobin remained stable  Review of Systems  Constitutional: Negative for fever, chills and weight loss.  HENT: Negative for congestion.   Eyes: Negative for blurred vision and double vision.  Respiratory: Negative for cough, sputum production, shortness of breath and wheezing.   Cardiovascular: Negative for chest pain, palpitations, orthopnea, leg swelling and PND.  Gastrointestinal: Negative for nausea, vomiting, abdominal pain, diarrhea, constipation and blood in stool.  Genitourinary: Negative for dysuria, urgency, frequency and hematuria.  Musculoskeletal: Positive for back pain. Negative for falls.  Neurological: Positive for tingling and sensory change. Negative for dizziness, tremors, focal weakness and headaches.  Endo/Heme/Allergies: Does not bruise/bleed easily.  Psychiatric/Behavioral: Negative for depression. The patient does not have insomnia.      VITAL SIGNS: Blood pressure 114/68, pulse 55, temperature 98.2 F (36.8 C), temperature source Oral, resp. rate 16, height  (1.778 m), weight 64.093 kg (141 lb 4.8 oz), SpO2 100 %.  PHYSICAL EXAMINATION:   GENERAL:  25 y.o.-year-old patient lying in the bed with no acute distress. Pink complexion EYES: Pupils equal, round, reactive to light and accommodation. No scleral icterus. Extraocular muscles intact.  HEENT: Head atraumatic, normocephalic. Oropharynx and nasopharynx clear.  NECK:  Supple, no jugular venous distention. No thyroid enlargement, no tenderness.  LUNGS: Normal breath sounds bilaterally, no wheezing, rales,rhonchi or crepitation. No use of accessory muscles of respiration.  CARDIOVASCULAR: S1, S2 normal. No murmurs, rubs, or gallops.  ABDOMEN: Soft, nontender, nondistended. Bowel sounds present. No organomegaly or mass.  EXTREMITIES: No pedal edema, cyanosis, or clubbing. Lower extremity contractures NEUROLOGIC: Cranial nerves II through XII are intact. Muscle strength 5/5 in all extremities. Sensation intact. Gait not checked.  PSYCHIATRIC: The patient is alert and oriented x 3.  SKIN: No obvious rash, lesion, or ulcer.   ORDERS/RESULTS REVIEWED:   CBC  Recent Labs Lab 09/07/15 2001 09/08/15 0539 09/08/15 1058 09/09/15 0331 09/10/15 0449  WBC 13.9* 7.7  --   --   --   HGB 6.7* 6.7* 6.6* 8.4* 8.5*  HCT 19.0* 19.0*  --  29.9*  --   PLT 406 194  --   --   --   MCV 85.5 82.2  --   --   --   MCH 30.2 28.8  --   --   --   MCHC 35.4 35.1  --   --   --   RDW 13.4 14.4  --   --   --  LYMPHSABS 3.4  --   --   --   --   MONOABS 1.5*  --   --   --   --   EOSABS 0.0  --   --   --   --   BASOSABS 0.0  --   --   --   --    ------------------------------------------------------------------------------------------------------------------  Chemistries   Recent Labs Lab 09/07/15 2001 09/08/15 0539 09/10/15 0449  NA 136 134* 139  K 3.5 3.7  --   CL 102  106  --   CO2 22 23  --   GLUCOSE 156* 99  --   BUN 34* 27*  --   CREATININE 0.97 0.88  --   CALCIUM 9.1 8.2*  --   AST 39  --   --   ALT 26  --   --   ALKPHOS 51  --   --   BILITOT 0.6  --   --    ------------------------------------------------------------------------------------------------------------------ estimated creatinine clearance is 117.4 mL/min (by C-G formula based on Cr of 0.88). ------------------------------------------------------------------------------------------------------------------ No results for input(s): TSH, T4TOTAL, T3FREE, THYROIDAB in the last 72 hours.  Invalid input(s): FREET3  Cardiac Enzymes No results for input(s): CKMB, TROPONINI, MYOGLOBIN in the last 168 hours.  Invalid input(s): CK ------------------------------------------------------------------------------------------------------------------ Invalid input(s): POCBNP ---------------------------------------------------------------------------------------------------------------  RADIOLOGY: No results found.  EKG: No orders found for this or any previous visit.  ASSESSMENT AND PLAN:  Principal Problem:   GI bleed Active Problems:   History of traumatic brain injury   Leukocytosis   Blood loss anemia  #1. Acute posthemorrhagic anemia, status post packed red blood cell transfusion, hemoglobin level has improved to 8.5 today. Following hemoglobin level periodically, no active bleeding noted  #2. Gastrointestinal bleed from duodenal bulb ulcer, due to nonsteroidal anti-inflammatory medications, continue Protonix IV twice a day, changed to oral tomorrow morning, status post EGD 20th of April 2017 revealing duodenal bulb ulcer, discontinue IV fluids #3. Hyponatremia, resolved on IV fluids   #4. Leukocytosis, resolved, no infection noted, likely stress related #5. Spinal cord injury with resultant paraparesis, patient will be evaluated by physical therapist, out of bed to chair as tolerated,  discussed with nursing staff  Management plans discussed with the patient, family and they are in agreement.   DRUG ALLERGIES: No Known Allergies  CODE STATUS:     Code Status Orders        Start     Ordered   09/08/15 0112  Full code   Continuous     09/08/15 0111    Code Status History    Date Active Date Inactive Code Status Order ID Comments User Context   This patient has a current code status but no historical code status.      TOTAL critical care TIME TAKING CARE OF THIS PATIENT: 45 minutes.  Discussed with the patient's mother, all questions were answered, voiced understanding, time spent approximately 10 minutes on discussions with family  Genevia Bouldin M.D on 09/10/2015 at 3:05 PM  Between 7am to 6pm - Pager - [phone]  After 6pm go to [email or web address] - password [email or web address] [email or web address]  [facility]  Office  [phone]  CC: Primary care physician; No primary care provider on file.

## 2015-09-11 MED ORDER — PANTOPRAZOLE SODIUM 40 MG PO TBEC: 40.0000 mg | DELAYED_RELEASE_TABLET | Freq: Two times a day (BID) | ORAL | Status: AC

## 2015-09-11 NOTE — Discharge Summary (Signed)
[facility] - [place] [facility]   PATIENT NAME: Devon Rice    MR#:  [id number]  DATE OF BIRTH:  16-Sep-1990  DATE OF ADMISSION:  09/07/2015 ADMITTING PHYSICIAN: Oralia Manisavid Willis, MD  DATE OF DISCHARGE: 09/11/2015  2:38 PM  PRIMARY CARE PHYSICIAN: No primary care provider on file.    ADMISSION DIAGNOSIS:  Anemia, unspecified anemia type [D64.9] Gastrointestinal hemorrhage, unspecified gastritis, unspecified gastrointestinal hemorrhage type [K92.2]  DISCHARGE DIAGNOSIS:  Principal Problem:   GI bleed Active Problems:   History of traumatic brain injury   Leukocytosis   Blood loss anemia   SECONDARY DIAGNOSIS:   Past Medical History  Diagnosis Date  . Brain injury (HCC)   . 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  1. GI bleed-this was the cause of patient's anemia and melanotic stools. This was an upper GI bleed. -Patient was seen by gastroenterology and underwent upper GI endoscopy which showed a grade B erosive esophagitis and also a nonobstructing nonbleeding duodenal ulcer. This was secondary to excessive NSAID use. -Post endoscopy patient has had no evidence of further bleeding. His hemoglobin is stable and is tolerating a soft diet well and therefore being discharged home with follow-up with gastroenterology as an outpatient.  2. Anemia-this was acute blood loss anemia secondary to the GI bleed. -Patient was transfused while in the hospital and hemoglobin has improved posttransfusion is currently stable 8.5. His admission hemoglobin was 6.7. He has been strongly advised to abstain from NSAIDs.  3. History of traumatic brain injury-continue Lyrica, trazodone.  DISCHARGE CONDITIONS:   Stable  CONSULTS OBTAINED:   Treatment Team:  Christena DeemMartin U Skulskie, MD  DRUG ALLERGIES:  No Known Allergies  DISCHARGE MEDICATIONS:   Discharge Medication List as of 09/11/2015  1:25 PM    START taking these medications   Details  pantoprazole (PROTONIX) 40 MG tablet Take 1 tablet (40 mg total) by mouth 2 (two) times daily., Starting 09/11/2015, Until Discontinued, Print      CONTINUE these medications which have NOT CHANGED   Details  diphenhydramine-acetaminophen (TYLENOL PM) 25-500 MG TABS tablet Take 2 tablets by mouth at bedtime as needed (for sleep/pain)., Until Discontinued, Historical Med    donepezil (ARICEPT) 10 MG tablet Take 10 mg by mouth at bedtime., Until Discontinued, Historical Med    !! pregabalin (LYRICA) 200 MG capsule Take 200 mg by mouth 3 (three) times daily., Until Discontinued, Historical Med    !! pregabalin (LYRICA) 200 MG capsule Take 200 mg by mouth 2 (two) times daily., Until Discontinued, Historical Med    traZODone (DESYREL) 50 MG tablet Take 50 mg by mouth at bedtime., Until Discontinued, Historical Med     !! - Potential duplicate medications found. Please discuss with provider.       DISCHARGE INSTRUCTIONS:   DIET:  Regular diet  DISCHARGE CONDITION:  Stable  ACTIVITY:  Activity as tolerated  OXYGEN:  Home Oxygen: No.   Oxygen Delivery: room air  DISCHARGE LOCATION:  home   If you experience worsening of your admission symptoms, develop shortness of breath, life threatening emergency, suicidal or homicidal thoughts you must seek medical attention immediately by calling 911 or calling your MD immediately  if symptoms less severe.  You Must read complete  instructions/literature along with all the possible adverse reactions/side effects for all the Medicines you take and that have been prescribed to you. Take any new Medicines after you have completely understood and accpet all the possible adverse reactions/side effects.   Please note  You were cared for by a  hospitalist during your hospital stay. If you have any questions about your discharge medications or the care you received while you were in the hospital after you are discharged, you can call the unit and asked to speak with the hospitalist on call if the hospitalist that took care of you is not available. Once you are discharged, your primary care physician will handle any further medical issues. Please note that NO REFILLS for any discharge medications will be authorized once you are discharged, as it is imperative that you return to your primary care physician (or establish a relationship with a primary care physician if you do not have one) for your aftercare needs so that they can reassess your need for medications and monitor your lab values.     Today   No abdominal pain, chest pain, shortness of breath. Hemoglobin stable.  VITAL SIGNS:  Blood pressure 112/71, pulse 55, temperature 97.6 F (36.4 C), temperature source Oral, resp. rate 16, height  (1.778 m), weight 64.093 kg (141 lb 4.8 oz), SpO2 100 %.  I/O:   Intake/Output Summary (Last 24 hours) at 09/11/15 1507 Last data filed at 09/11/15 0900  Gross per 24 hour  Intake   1750 ml  Output      0 ml  Net   1750 ml    PHYSICAL EXAMINATION:  GENERAL:  25 y.o.-year-old patient lying in the bed with no acute distress.  EYES: Pupils equal, round, reactive to light and accommodation. No scleral icterus. Extraocular muscles intact.  HEENT: Head atraumatic, normocephalic. Oropharynx and nasopharynx clear.  NECK:  Supple, no jugular venous distention. No thyroid enlargement, no tenderness.  LUNGS: Normal breath sounds bilaterally, no wheezing, rales,rhonchi. No use of accessory muscles of respiration.  CARDIOVASCULAR: S1, S2 normal. No murmurs, rubs, or gallops.  ABDOMEN: Soft, non-tender, non-distended. Bowel sounds present. No organomegaly or mass.  EXTREMITIES: No pedal edema, cyanosis, or clubbing.  NEUROLOGIC: Cranial  nerves II through XII are intact. Bilateral lower extremity weakness due to traumatic brain injury.  PSYCHIATRIC: The patient is alert and oriented x 3. Good affect.  SKIN: No obvious rash, lesion, or ulcer.   DATA REVIEW:   CBC  Recent Labs Lab 09/08/15 0539  09/09/15 0331 09/10/15 0449  WBC 7.7  --   --   --   HGB 6.7*  < > 8.4* 8.5*  HCT 19.0*  --  29.9*  --   PLT 194  --   --   --   < > = values in this interval not displayed.  Chemistries   Recent Labs Lab 09/07/15 2001 09/08/15 0539 09/10/15 0449  NA 136 134* 139  K 3.5 3.7  --   CL 102 106  --   CO2 22 23  --   GLUCOSE 156* 99  --   BUN 34* 27*  --   CREATININE 0.97 0.88  --   CALCIUM 9.1 8.2*  --   AST 39  --   --   ALT 26  --   --   ALKPHOS 51  --   --   BILITOT 0.6  --   --     Cardiac Enzymes No results for input(s):  TROPONINI in the last 168 hours.  Microbiology Results  Results for orders placed or performed during the hospital encounter of 09/07/15  Gastrointestinal Panel by PCR , Stool     Status: None   Collection Time: 09/07/15  9:22 PM  Result Value Ref Range Status   Campylobacter species NOT DETECTED NOT DETECTED Final   Plesimonas shigelloides NOT DETECTED NOT DETECTED Final   Salmonella species NOT DETECTED NOT DETECTED Final   Yersinia enterocolitica NOT DETECTED NOT DETECTED Final   Vibrio species NOT DETECTED NOT DETECTED Final   Vibrio cholerae NOT DETECTED NOT DETECTED Final   Enteroaggregative E coli (EAEC) NOT DETECTED NOT DETECTED Final   Enteropathogenic E coli (EPEC) NOT DETECTED NOT DETECTED Final   Enterotoxigenic E coli (ETEC) NOT DETECTED NOT DETECTED Final   Shiga like toxin producing E coli (STEC) NOT DETECTED NOT DETECTED Final   E. coli O157 NOT DETECTED NOT DETECTED Final   Shigella/Enteroinvasive E coli (EIEC) NOT DETECTED NOT DETECTED Final   Cryptosporidium NOT DETECTED NOT DETECTED Final   Cyclospora cayetanensis NOT DETECTED NOT DETECTED Final   Entamoeba  histolytica NOT DETECTED NOT DETECTED Final   Giardia lamblia NOT DETECTED NOT DETECTED Final   Adenovirus F40/41 NOT DETECTED NOT DETECTED Final   Astrovirus NOT DETECTED NOT DETECTED Final   Norovirus GI/GII NOT DETECTED NOT DETECTED Final   Rotavirus A NOT DETECTED NOT DETECTED Final   Sapovirus (I, II, IV, and V) NOT DETECTED NOT DETECTED Final  MRSA PCR Screening     Status: Abnormal   Collection Time: 09/08/15  2:28 AM  Result Value Ref Range Status   MRSA by PCR (A) NEGATIVE Final    INVALID, UNABLE TO DETERMINE THE PRESENCE OF TARGET DNA DUE TO SPECIMEN INTEGRITY. RECOLLECTION REQUESTED.    Comment:        The GeneXpert MRSA Assay (FDA approved for NASAL specimens only), is one component of a comprehensive MRSA colonization surveillance program. It is not intended to diagnose MRSA infection nor to guide or monitor treatment for MRSA infections. NOTIFIED Victorino Sparrow AT 1610 ON 09/08/15 OF RECOLLECT NEEDED.Marland KitchenMarland KitchenFountain Valley Rgnl Hosp And [facility] - [facility]   MRSA PCR Screening     Status: None   Collection Time: 09/08/15  6:48 AM  Result Value Ref Range Status   MRSA by PCR NEGATIVE NEGATIVE Final    Comment:        The GeneXpert MRSA Assay (FDA approved for NASAL specimens only), is one component of a comprehensive MRSA colonization surveillance program. It is not intended to diagnose MRSA infection nor to guide or monitor treatment for MRSA infections.     RADIOLOGY:  No results found.    Management plans discussed with the patient, family and they are in agreement.  CODE STATUS:     Code Status Orders        Start     Ordered   09/08/15 0112  Full code   Continuous     09/08/15 0111    Code Status History    Date Active Date Inactive Code Status Order ID Comments User Context   This patient has a current code status but no historical code status.      TOTAL TIME TAKING CARE OF THIS PATIENT: 40 minutes.    Houston Siren M.D on 09/11/2015 at 3:07 PM  Between 7am to 6pm - Pager -  [phone]  After 6pm go to [email or web address] - password [email or web address] [facility]  [facility]  Office  [phone]  CC: Primary care physician; No  primary care provider on file.

## 2015-09-11 NOTE — Progress Notes (Signed)
Order for discharge.  AVS, Rx, f/u appt to be scheduled reviewed.  All questions answered; information printed regarding PU/DU dz and ulcer diet.  Escorted out for d/c via wheelchair w/ NT.

## 2015-09-11 NOTE — Consult Note (Signed)
Subjective: Patient seen for UGIB.  Patient doing well, no recurrent emesis or repeat bleeding.  No abdominal pain, advancing diet well.    Objective: Vital signs in last 24 hours: Temp:  [97.6 F (36.4 C)-98.2 F (36.8 C)] 97.6 F (36.4 C) (04/22 0636) Pulse Rate:  [55-68] 55 (04/22 0636) Resp:  [16] 16 (04/22 0636) BP: (112-124)/(68-71) 112/71 mmHg (04/22 0636) SpO2:  [100 %] 100 % (04/22 0636) Blood pressure 112/71, pulse 55, temperature 97.6 F (36.4 C), temperature source Oral, resp. rate 16, height 5\' 10"  (1.778 m), weight 64.093 kg (141 lb 4.8 oz), SpO2 100 %.   Intake/Output from previous day: 04/21 0701 - 04/22 0700 In: 1760 [P.O.:1760] Out: -   Intake/Output this shift: Total I/O In: 750 [P.O.:750] Out: -    General appearance:  Well appearing [age] male no distress. Resp:  CTAB. No wheezes, rales, or rhonchi. Cardio:  Rrr. No m/r/g. GI:  Soft, nontender nondistended, bowel sounds positive normoactive.  Extremities:  No edema, clubbing.   Lab Results: No results found for this or any previous visit (from the past 24 hour(s)).    Recent Labs  09/09/15 0331 09/10/15 0449  HGB 8.4* 8.5*  HCT 29.9*  --    BMET  Recent Labs  09/10/15 0449  NA 139    Studies/Results: No results found.  Scheduled Inpatient Medications:   . donepezil  10 mg Oral QHS  . pantoprazole (PROTONIX) IV  40 mg Intravenous Q12H  . pregabalin  200 mg Oral TID  . sodium chloride flush  3 mL Intravenous Q12H  . 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  No repeat.  HGB stable.    Plan:  1) continue current.  Slow advancement of diet requested due to location of DU.  Continue bid ppi as an outpatient after discharge. The pt will need to follow up as  an outpatient in 2-3 weeks as previously discussed by Dr. Marva PandaSkulskie.  Katina Remick L. Zachery DauerBarnes, MD, MPH 09/11/2015, 11:44 AM

## 2015-09-12 ENCOUNTER — Encounter: Payer: Self-pay | Admitting: Gastroenterology

## 2015-09-17 LAB — TYPE AND SCREEN
ABO/RH(D): A POS
Antibody Screen: NEGATIVE
UNIT DIVISION: 0
Unit division: 0
Unit division: 0
Unit division: 0
